# Patient Record
Sex: Female | Born: 1980 | Race: White | Hispanic: No | Marital: Single | Smoking: Never smoker
Health system: Southern US, Community
[De-identification: ages and names within clinical notes are randomized; demographics above are authoritative.]

## PROBLEM LIST (undated history)

## (undated) ENCOUNTER — Inpatient Hospital Stay (HOSPITAL_COMMUNITY): Payer: Self-pay

## (undated) DIAGNOSIS — B999 Unspecified infectious disease: Secondary | ICD-10-CM

## (undated) DIAGNOSIS — Z1589 Genetic susceptibility to other disease: Secondary | ICD-10-CM

## (undated) DIAGNOSIS — E7212 Methylenetetrahydrofolate reductase deficiency: Secondary | ICD-10-CM

## (undated) DIAGNOSIS — G971 Other reaction to spinal and lumbar puncture: Secondary | ICD-10-CM

## (undated) DIAGNOSIS — T7840XA Allergy, unspecified, initial encounter: Secondary | ICD-10-CM

## (undated) DIAGNOSIS — D759 Disease of blood and blood-forming organs, unspecified: Secondary | ICD-10-CM

## (undated) HISTORY — PX: TUBAL LIGATION: SHX77

## (undated) HISTORY — PX: TONSILLECTOMY AND ADENOIDECTOMY: SUR1326

## (undated) HISTORY — DX: Allergy, unspecified, initial encounter: T78.40XA

## (undated) HISTORY — PX: CHOLECYSTECTOMY: SHX55

## (undated) HISTORY — PX: WISDOM TOOTH EXTRACTION: SHX21

## (undated) HISTORY — DX: Other reaction to spinal and lumbar puncture: G97.1

---

## 2011-07-24 ENCOUNTER — Inpatient Hospital Stay (HOSPITAL_COMMUNITY)
Admission: EM | Admit: 2011-07-24 | Discharge: 2011-07-29 | DRG: 602 | Disposition: A | Discharge status: Home / Self Care | Payer: No Typology Code available for payment source | Attending: Internal Medicine | Admitting: Internal Medicine

## 2011-07-24 ENCOUNTER — Emergency Department (HOSPITAL_COMMUNITY): Payer: No Typology Code available for payment source

## 2011-07-24 ENCOUNTER — Encounter (HOSPITAL_COMMUNITY): Payer: Self-pay | Admitting: Radiology

## 2011-07-24 DIAGNOSIS — D72829 Elevated white blood cell count, unspecified: Secondary | ICD-10-CM | POA: Diagnosis present

## 2011-07-24 DIAGNOSIS — E7210 Disorders of sulfur-bearing amino-acid metabolism, unspecified: Secondary | ICD-10-CM | POA: Diagnosis present

## 2011-07-24 DIAGNOSIS — R000 Tachycardia, unspecified: Secondary | ICD-10-CM | POA: Diagnosis present

## 2011-07-24 DIAGNOSIS — I951 Orthostatic hypotension: Secondary | ICD-10-CM | POA: Diagnosis present

## 2011-07-24 DIAGNOSIS — R6510 Systemic inflammatory response syndrome (SIRS) of non-infectious origin without acute organ dysfunction: Secondary | ICD-10-CM | POA: Diagnosis present

## 2011-07-24 DIAGNOSIS — G43909 Migraine, unspecified, not intractable, without status migrainosus: Secondary | ICD-10-CM | POA: Diagnosis present

## 2011-07-24 DIAGNOSIS — L089 Local infection of the skin and subcutaneous tissue, unspecified: Secondary | ICD-10-CM | POA: Diagnosis present

## 2011-07-24 DIAGNOSIS — Z888 Allergy status to other drugs, medicaments and biological substances status: Secondary | ICD-10-CM

## 2011-07-24 DIAGNOSIS — Z882 Allergy status to sulfonamides status: Secondary | ICD-10-CM

## 2011-07-24 DIAGNOSIS — D649 Anemia, unspecified: Secondary | ICD-10-CM | POA: Diagnosis present

## 2011-07-24 DIAGNOSIS — L02619 Cutaneous abscess of unspecified foot: Principal | ICD-10-CM | POA: Diagnosis present

## 2011-07-24 DIAGNOSIS — A419 Sepsis, unspecified organism: Secondary | ICD-10-CM | POA: Diagnosis present

## 2011-07-24 DIAGNOSIS — E876 Hypokalemia: Secondary | ICD-10-CM | POA: Diagnosis not present

## 2011-07-24 DIAGNOSIS — D72825 Bandemia: Secondary | ICD-10-CM | POA: Diagnosis present

## 2011-07-24 DIAGNOSIS — N289 Disorder of kidney and ureter, unspecified: Secondary | ICD-10-CM | POA: Diagnosis present

## 2011-07-24 DIAGNOSIS — S90426A Blister (nonthermal), unspecified lesser toe(s), initial encounter: Secondary | ICD-10-CM | POA: Diagnosis present

## 2011-07-24 LAB — CBC
HCT: 38.1 % (ref 36.0–46.0)
Hemoglobin: 13.2 g/dL (ref 12.0–15.0)
MCH: 29 pg (ref 26.0–34.0)
MCHC: 34.6 g/dL (ref 30.0–36.0)
RDW: 13 % (ref 11.5–15.5)

## 2011-07-24 LAB — DIFFERENTIAL
Basophils Absolute: 0 10*3/uL (ref 0.0–0.1)
Lymphs Abs: 0.1 10*3/uL — ABNORMAL LOW (ref 0.7–4.0)
Monocytes Absolute: 0.4 10*3/uL (ref 0.1–1.0)
Monocytes Relative: 3 % (ref 3–12)
Neutro Abs: 14.1 10*3/uL — ABNORMAL HIGH (ref 1.7–7.7)

## 2011-07-24 LAB — URINALYSIS, ROUTINE W REFLEX MICROSCOPIC
Bilirubin Urine: NEGATIVE
Glucose, UA: NEGATIVE mg/dL
Ketones, ur: NEGATIVE mg/dL
pH: 5 (ref 5.0–8.0)

## 2011-07-24 LAB — POCT I-STAT, CHEM 8
Calcium, Ion: 1.24 mmol/L (ref 1.12–1.32)
Chloride: 107 mEq/L (ref 96–112)
Glucose, Bld: 115 mg/dL — ABNORMAL HIGH (ref 70–99)
HCT: 43 % (ref 36.0–46.0)
TCO2: 19 mmol/L (ref 0–100)

## 2011-07-24 LAB — URINE MICROSCOPIC-ADD ON

## 2011-07-24 LAB — LACTIC ACID, PLASMA: Lactic Acid, Venous: 1.4 mmol/L (ref 0.5–2.2)

## 2011-07-24 LAB — POCT PREGNANCY, URINE: Preg Test, Ur: NEGATIVE

## 2011-07-24 MED ORDER — IOHEXOL 300 MG/ML  SOLN
160.0000 mL | Freq: Once | INTRAMUSCULAR | Status: AC | PRN
  Administered 2011-07-24: 160 mL via INTRAVENOUS

## 2011-07-25 LAB — COMPREHENSIVE METABOLIC PANEL
ALT: 34 U/L (ref 0–35)
AST: 47 U/L — ABNORMAL HIGH (ref 0–37)
CO2: 20 mEq/L (ref 19–32)
Calcium: 7.6 mg/dL — ABNORMAL LOW (ref 8.4–10.5)
Chloride: 111 mEq/L (ref 96–112)
Creatinine, Ser: 0.89 mg/dL (ref 0.50–1.10)
GFR calc Af Amer: >60 mL/min (ref >60)
GFR calc non Af Amer: >60 mL/min (ref >60)
Glucose, Bld: 137 mg/dL — ABNORMAL HIGH (ref 70–99)
Total Bilirubin: 0.7 mg/dL (ref 0.3–1.2)

## 2011-07-25 LAB — DIFFERENTIAL
Basophils Absolute: 0 10*3/uL (ref 0.0–0.1)
Eosinophils Absolute: 0 10*3/uL (ref 0.0–0.7)
Lymphs Abs: 0.1 10*3/uL — ABNORMAL LOW (ref 0.7–4.0)
Monocytes Absolute: 0.4 10*3/uL (ref 0.1–1.0)

## 2011-07-25 LAB — MRSA PCR SCREENING: MRSA by PCR: NEGATIVE

## 2011-07-25 LAB — CBC
MCHC: 34.5 g/dL (ref 30.0–36.0)
RDW: 13.1 % (ref 11.5–15.5)

## 2011-07-26 LAB — CBC
HCT: 31.7 % — ABNORMAL LOW (ref 36.0–46.0)
Hemoglobin: 11.2 g/dL — ABNORMAL LOW (ref 12.0–15.0)
MCV: 83.9 fL (ref 78.0–100.0)
WBC: 8.7 10*3/uL (ref 4.0–10.5)

## 2011-07-26 LAB — DIFFERENTIAL
Basophils Relative: 0 % (ref 0–1)
Eosinophils Relative: 3 % (ref 0–5)
Lymphocytes Relative: 3 % — ABNORMAL LOW (ref 12–46)
Neutro Abs: 7.8 10*3/uL — ABNORMAL HIGH (ref 1.7–7.7)
Neutrophils Relative %: 90 % — ABNORMAL HIGH (ref 43–77)

## 2011-07-26 LAB — COMPREHENSIVE METABOLIC PANEL
Alkaline Phosphatase: 71 U/L (ref 39–117)
BUN: 8 mg/dL (ref 6–23)
CO2: 21 mEq/L (ref 19–32)
Chloride: 109 mEq/L (ref 96–112)
Creatinine, Ser: 1.12 mg/dL — ABNORMAL HIGH (ref 0.50–1.10)
GFR calc Af Amer: >60 mL/min (ref >60)
GFR calc non Af Amer: 58 mL/min — ABNORMAL LOW (ref >60)
Glucose, Bld: 116 mg/dL — ABNORMAL HIGH (ref 70–99)
Potassium: 3 mEq/L — ABNORMAL LOW (ref 3.5–5.1)
Total Bilirubin: 0.6 mg/dL (ref 0.3–1.2)

## 2011-07-27 LAB — CBC
MCH: 28.8 pg (ref 26.0–34.0)
Platelets: 154 10*3/uL (ref 150–400)
RBC: 3.68 MIL/uL — ABNORMAL LOW (ref 3.87–5.11)
WBC: 7 10*3/uL (ref 4.0–10.5)

## 2011-07-27 LAB — CULTURE, BLOOD (ROUTINE X 2)

## 2011-07-27 LAB — BASIC METABOLIC PANEL
CO2: 21 mEq/L (ref 19–32)
Calcium: 8.3 mg/dL — ABNORMAL LOW (ref 8.4–10.5)
Potassium: 3.7 mEq/L (ref 3.5–5.1)
Sodium: 137 mEq/L (ref 135–145)

## 2011-07-28 LAB — BASIC METABOLIC PANEL
BUN: 8 mg/dL (ref 6–23)
CO2: 19 mEq/L (ref 19–32)
Chloride: 113 mEq/L — ABNORMAL HIGH (ref 96–112)
GFR calc non Af Amer: >60 mL/min (ref >60)
Glucose, Bld: 94 mg/dL (ref 70–99)
Potassium: 3.2 mEq/L — ABNORMAL LOW (ref 3.5–5.1)
Sodium: 140 mEq/L (ref 135–145)

## 2011-07-28 LAB — CBC
HCT: 32.2 % — ABNORMAL LOW (ref 36.0–46.0)
Hemoglobin: 11 g/dL — ABNORMAL LOW (ref 12.0–15.0)
MCHC: 34.2 g/dL (ref 30.0–36.0)
RBC: 3.86 MIL/uL — ABNORMAL LOW (ref 3.87–5.11)

## 2011-07-29 LAB — BASIC METABOLIC PANEL
BUN: 9 mg/dL (ref 6–23)
CO2: 22 mEq/L (ref 19–32)
Calcium: 9.2 mg/dL (ref 8.4–10.5)
Chloride: 110 mEq/L (ref 96–112)
Creatinine, Ser: 1.03 mg/dL (ref 0.50–1.10)
GFR calc Af Amer: >60 mL/min (ref >60)
GFR calc non Af Amer: >60 mL/min (ref >60)
Glucose, Bld: 101 mg/dL — ABNORMAL HIGH (ref 70–99)
Potassium: 3.3 mEq/L — ABNORMAL LOW (ref 3.5–5.1)
Sodium: 142 mEq/L (ref 135–145)

## 2011-07-29 LAB — VANCOMYCIN, TROUGH: Vancomycin Tr: 28.6 ug/mL (ref 10.0–20.0)

## 2011-07-30 NOTE — Consult Note (Signed)
NAMEIMMACULATE, CRUTCHER NO.:  [id number]  MEDICAL RECORD NO.:  [id number]  LOCATION:  5015                         FACILITY:  [facility]  PHYSICIAN:  Toni Arthurs, MD        DATE OF BIRTH:  21-Aug-1981  DATE OF CONSULTATION: DATE OF DISCHARGE:                                CONSULTATION   REASON FOR CONSULTATION:  Right foot cellulitis and ulcer formation.  HISTORY OF PRESENT ILLNESS:  The patient is a 30 year old woman without significant past medical history who was admitted last week for cellulitis of her right foot as well as a syncopal episode at work.  She says the blister started on Wednesday of last week when she was walking a lot at her new job and heels.  She says that by Friday the blister had popped and she put a Band-Aid on it.  Later that day at work she developed lightheadedness and dizziness.  She came to the emergency department and was found to have an elevated white blood cell count. She was admitted to the step-down unit and started on vancomycin and Zosyn.  She has had clinical resolution of her cellulitis but persist with having a large fluctuant mass filled with purulent material.  I am consulted for further evaluation and treatment of this condition.  The patient denies any history of diabetes or thyroid trouble.  She is not a smoker.  PAST MEDICAL HISTORY:  Migraines.  PAST SURGICAL HISTORY:  None.  SOCIAL HISTORY:  The patient does not use any tobacco products.  She works in Presenter, broadcasting at [employer].  FAMILY HISTORY:  Negative for any diabetes.  PHYSICAL EXAMINATION:  The patient is a well nourished, well developed young woman in no apparent stress.  She is alert and oriented x4.  Mood and affect is normal.  Extraocular motions are intact.  Respirations are unlabored.  Her right foot has a blister which is approximately 3 cm x 2 cm and it is quite raised and has at least a centimeter high.  There is a scant amount of  erythema around the periphery of it.  The blisters itself seems to be filled with either purulent material or necrotic material.  It is fluctuant.  It is quite tender to palpation.  The dorsum of the foot is somewhat tender but her extrinsic tendon is 5/5 strength.  She has 2+ dorsalis pedis and posterior tibial pulses.  She feels light touch normally throughout her foot.  There is no lymphadenopathy noted.  X-rays, plain films of her foot are reviewed and these are normal with no evidence of erosion into the cortical bone at the fifth toe.  Labs, her white blood cell count is normal.  Her vital signs have all been stable and she has been afebrile as well.  ASSESSMENT:  Resolving right foot cellulitis with purulent or necrotic material in this painful blister.  PLAN:  I explained the nature of this situation to the patient and her father in detail.  I believe that this would at least be much more comfortable with the blister removed.  I think this will also allow [place] to inspect the  underlying tissues to see if these are at any risk for further deterioration.  She will lose the protective covering of the skin over this area, but we will also lose the necrotic material which can serve as a nidus for further infection.  She understands the risks and benefits of this debridement including risks of bleeding, infection, nerve damage, blood clots, need for surgery, amputation and even death.  She would like to proceed.  PROCEDURE IN DETAIL:  After informed consent, I debrided the blister of its covering in all of the necrotic material.  The underlying subcutaneous tissue was intact and healthy appearing with no necrotic areas.  There was no evidence of any exposed tendon joint or bone.  I irrigated this wound copiously with sterile saline.  We applied a nonadherent dressing and a compression wrap  We will let her bear weight as tolerated on the right foot in a hard sole shoe.  She is  going to shower and clean it off daily and apply dry dressing.  I will follow her while she is in the hospital and then she will follow up with me a week or so after her discharge from the hospital.  The patient tolerated the procedure well.  There were no evident of complications.     Toni Arthurs, MD     JH/MEDQ  D:  07/28/2011  T:  07/29/2011  Job:  [id number]  Electronically Signed by Toni Arthurs  on 07/30/2011 12:12:18 PM

## 2011-07-31 LAB — CULTURE, BLOOD (SINGLE)
Culture  Setup Time: 201209221102
Culture: NO GROWTH

## 2011-07-31 LAB — CULTURE, BLOOD (ROUTINE X 2)

## 2011-08-05 NOTE — H&P (Signed)
Lisa Weaver, TEXIDOR NO.:  [id number]  MEDICAL RECORD NO.:  [id number]  LOCATION:  [facility]                         FACILITY:  [facility]  PHYSICIAN:  Carlota Raspberry, MD         DATE OF BIRTH:  10-16-1981  DATE OF ADMISSION:  07/24/2011 DATE OF DISCHARGE:                             HISTORY & PHYSICAL   PRIMARY CARE PHYSICIAN:  Does not have one.  CHIEF COMPLAINTS:  Fevers, tachycardia, right foot erythema.  HISTORY OF PRESENT ILLNESS:  This is a 30 year old female overall healthy but has a history of MTHFR gene mutation who presents with an apparent right foot infection, hypertension, and tachycardia.  The patient was in her usual state of health until a couple of days ago when she bought a new pair of heels and was wearing them yesterday morning.  She noticed that there was a blister on the side of her right pinky toe that she put some antibiotic ointment on and went about her daily business and then this morning she noticed that there was some right streakiness on her foot and at work she had a presyncopal event where she felt nauseated, dizziness, and almost passed out but she did not have any loss of consciousness.  She came to the emergency room where initial vital signs were 98.9, blood pressure 124/82, and a pulse of 127 with respirations 19 and 96% on room air.  She proceeded to spike a fever up to 101.6 in the emergency room and they also noticed that the dorsal aspect of her right foot had an ascending line of redness.  She was given a total of 3 liters normal saline, 1 gram of vancomycin, 3.375 of Zosyn, and a gram of Tylenol.  She stated that she was having a little bit of mild abdominal pain and in consultation with Critical Care Medicine.  They then proceeded to scan her chest and abdomen and pelvis with contrast which basically showed nothing  She has been admitted to [facility] for further management of what appears to be an ascending  lymphangitis  In discussion with the patient, she relates the above history and states that otherwise she has been in her usual state of health.  She has no cardiopulmonary symptoms.  The belly pain she has never had before either and it is located in her lower quadrants but it appears to be pretty mild and not her prominent complaint.  She is otherwise without complaint at present.  PAST MEDICAL HISTORY:  Migraines for which she takes topiramate.  MTHFR gene mutation, this was diagnosed by an OB/GYN after she had a stillbirth at 8 months and a high risk OB/GYN doctor ran this test and found that she was positive.  It apparently is associated with hypercoagulability.  Otherwise she is healthy.  HOME MEDICATIONS:  Topamax 100 mg twice daily and an albuterol inhaler as needed.  ALLERGIES:  She has an allergy to SULFA, ALMOTRIPTAN, and CEFZIL.  SOCIAL HISTORY:  She just moved here not long ago and ambulatory working young lady.  She is a nonsmoker, nondrinker, does not do any drugs.  PHYSICAL EXAMINATION:  VITAL SIGNS:  Most  recent vital signs 93/51, ranging 93-100 on the monitor, pulse 133, 100% on room air and 25. GENERAL:  She is larger girl but not obese.  She is very diffusely red and warm and flushed but she is alert, conversant in no distress but does appear ill. HEENT:  Her pupils are equal, round, and reactive to light.  Her extraocular muscles are intact.  Her sclerae are clear.  Her mouth is a bit dry appearing. LUNGS:  Clear to auscultation bilaterally.  No wheezes, crackles, rales, or rhonchi. HEART:  Regular but tachycardic with no murmurs or gallops appreciated. ABDOMEN:  Soft, nontender, nondistended and frankly not that impressive in her bilateral lower quadrants. EXTREMITIES:  Warm, very flushed, well perfused.  Her bilateral lower extremities have no pitting edema in a classic sense but they do.  She does have right dorsal foot swelling. Her right fifth toe on  the lateral aspect has approximately 2 x 1 cm bullous blister surrounded by a ring of purple on the skin.  The blister has not broken and it is not purulent.  It is filled with clear fluid.  Coming from the blister and streaking up towards her dorsal foot is a linear but poorly defined erythematous streak.  It is tender to palpation and there is surrounding swelling that is mild.  The left foot appears normal but going up towards the knees there is some diffuse erythema.  LABORATORY WORK:  White blood cell count is 14.6 with 96% neutrophils and greater than 20% bands, hematocrit 38.1, platelets 213,000. Chemistry is completely normal.  Her urine pregnancy is negative.  Her UA is completely negative.  Her lactate is 1.4.  She has 2 blood cultures pending.  Radiography showed a foot x-ray which was negative and a CT angio of the chest and CT abdomen, pelvis with contrast which were unimpressive and did not explain her clinical presentation.  EKG is normal sinus rhythm with tachycardia to 130.  She has a normal axis.  She has some diffuse T-wave flattening that is nonspecific.  IMPRESSION:  This is a healthy 30 year old female with migraines and a history of methylene tetrahydrofolate reductase gene mutation who presents with fevers, tachycardia, leukocytosis, and bandemia and what appears to be a right foot ascending lymphangitis 1. Ascending lymphangitis.  We will continue on vancomycin and Zosyn     empirically and give her a gram of Tylenol scheduled.  We will get     LFTs x1.  We will blood culture her when she spikes.  I would also     mark the borders now and if it is rapidly worsening, then we would     need to get a CT to rule out necrotizing fasciitis. 2. Hypotension.  Her baseline blood pressures run 110 and she is just     below her baseline in the 90s-100s here.  She is making urine     output and her lactate is negative.  She is status post 3 liters.     We will bolus her  fourth and then give her continuous fluids and     monitor her closely in the Step-Down Unit. 3. Migraines.  Continue home Topamax. 4. __________ as above.  She can get a regular diet. 5. Prophylaxis.  Subcutaneous heparin, Colace, senna, Zofran,     scheduled Tylenol and morphine for breakthrough.  IV ACCESS:  She has a peripheral gauge 18 in her left.  CODE STATUS:  She is full.  I discussed this  with her.  She will be admitted to [facility] Team 7 in the Step-Down Unit.          ______________________________ Carlota Raspberry, MD     EB/MEDQ  D:  07/24/2011  T:  07/25/2011  Job:  [id number]  Electronically Signed by Carlota Raspberry MD on 08/05/2011 12:22:12 PM

## 2011-08-14 NOTE — Discharge Summary (Signed)
Lisa Weaver, Lisa Weaver                 ACCOUNT NO.:  [id number]  MEDICAL RECORD NO.:  [id number]  LOCATION:  5015                         FACILITY:  [facility]  PHYSICIAN:  Kela Millin, M.D.DATE OF BIRTH:  11-13-1980  DATE OF ADMISSION:  07/24/2011 DATE OF DISCHARGE:  07/29/2011                        DISCHARGE SUMMARY - REFERRING   DISCHARGE DIAGNOSES: 1. Right foot cellulitis with infected fifth toe blister. 2. Sepsis syndrome - secondary to right foot cellulitis with infected     fifth toe blister, resolved. 3. Hypokalemia - potassium replaced. 4. History of migraine headaches. 5. History of MTHFR gene mutation.  PROCEDURES AND STUDIES: 1. CT scan of the abdomen and pelvis on September 21 - there are no     acute findings identified within the abdomen and pelvis.  No     findings to explain fever and elevated white count. 2. X-ray of the right foot - normal exam. 3. CT angiogram of the chest - negative for acute pulmonary embolus. 4. Incision and drainage of the right fifth toe blister per Dr. Victorino Dike     on July 28, 2011.  CONSULTATIONS: 1. Orthopedics, Dr. Victorino Dike.  HISTORY:  Please see the history and physical dictated on admission for the full details of the history and laboratory data on admission.  HOSPITAL COURSE: 1. Right foot cellulitis with infected fifth toe blister - upon     admission, the patient had blood cultures done and was started on     empiric antibiotics with Levaquin and vancomycin.  The cellulitis     improved on the antibiotics, the blister was persisting, and wound     care was consulted to see the patient, and they did first stated     that topical care would not be effective, and recommended     Orthopedic consultation.  This was done and Dr. Victorino Dike saw the     patient and his impression was that her cellulitis was resolving,     but that blister had purulent or necrotic material in it and it was     painful.  He debrided the blister and  recommended to continue the     antibiotics.  He has recommended that she follows up with him     outpatient in a week.  Her leukocytosis resolved as well as     hypotension.  She had three cultures drawn while in the hospital     and one of those grew staph species and their impression was that     this was a contaminant.  She is clinically improved at this time     and a hard sole shoe for comfort was prescribed per Dr. Victorino Dike and     again she is to follow up outpatient. 2. Sepsis syndrome - the patient was noted to be hypotensive with     leukocytosis and was also tachycardic on admission.  She did have a     CT angiogram of the chest and a PE was ruled out.  CT scan of her     abdomen and pelvis was also done and no acute findings were noted.     The impression was that the  sepsis syndrome was secondary to Right     foot cellulitis with infected fifth toe blister, and resolved on     antibiotics as discussed above. 3. Hypokalemia - her potassium was replaced in the hospital. 4. History of migraine headaches - she was maintained on Topamax     during this hospital stay.  DISCHARGE MEDICATIONS: 1. Levaquin 500 mg p.o. daily for one more week. 2. Oxycodone 5 mg one to two tablets every 4 hours p.r.n. 3. Albuterol MDI q.4 h. p.r.n. 4. Topamax 100 mg p.o. b.i.d.  FOLLOWUP CARE: 1. Dr. Victorino Dike in 1 week, call for appointment. 2. Primary care physician in 1-2 weeks.  DISCHARGE CONDITION:  Improved/stable.     Kela Millin, M.D.     ACV/MEDQ  D:  07/29/2011  T:  07/29/2011  Job:  [id number]  cc:   Toni Arthurs, MD  Electronically Signed by Donnalee Curry M.D. on 08/14/2011 07:27:48 PM

## 2011-12-10 ENCOUNTER — Ambulatory Visit (INDEPENDENT_AMBULATORY_CARE_PROVIDER_SITE_OTHER): Payer: No Typology Code available for payment source | Admitting: Advanced Practice Midwife

## 2011-12-10 ENCOUNTER — Encounter: Payer: Self-pay | Admitting: Advanced Practice Midwife

## 2011-12-10 ENCOUNTER — Ambulatory Visit (HOSPITAL_COMMUNITY)
Admission: RE | Admit: 2011-12-10 | Discharge: 2011-12-10 | Disposition: A | Discharge status: Home / Self Care | Payer: No Typology Code available for payment source | Source: Ambulatory Visit | Attending: Advanced Practice Midwife | Admitting: Advanced Practice Midwife

## 2011-12-10 DIAGNOSIS — Z1589 Genetic susceptibility to other disease: Secondary | ICD-10-CM

## 2011-12-10 DIAGNOSIS — E7212 Methylenetetrahydrofolate reductase deficiency: Secondary | ICD-10-CM

## 2011-12-10 DIAGNOSIS — E7210 Disorders of sulfur-bearing amino-acid metabolism, unspecified: Secondary | ICD-10-CM

## 2011-12-10 DIAGNOSIS — Z30432 Encounter for removal of intrauterine contraceptive device: Secondary | ICD-10-CM

## 2011-12-10 DIAGNOSIS — IMO0001 Reserved for inherently not codable concepts without codable children: Secondary | ICD-10-CM

## 2011-12-10 DIAGNOSIS — Z975 Presence of (intrauterine) contraceptive device: Secondary | ICD-10-CM

## 2011-12-10 DIAGNOSIS — G43909 Migraine, unspecified, not intractable, without status migrainosus: Secondary | ICD-10-CM | POA: Insufficient documentation

## 2011-12-10 DIAGNOSIS — Z30431 Encounter for routine checking of intrauterine contraceptive device: Secondary | ICD-10-CM | POA: Insufficient documentation

## 2011-12-10 NOTE — Patient Instructions (Signed)

## 2011-12-10 NOTE — Progress Notes (Signed)
  Subjective:    Patient ID: Lisa Weaver, female    DOB: 1981-06-26, 31 y.o.   MRN: [id number]  HPI This is a 31 y.o. female with a Mirena IUD who presents for removal of it. States she went to [facility] and they were unable to find the strings. They tried removal with a cytobrush, without success.  She is near the end of the term of it (5 years in November) but wants it removed "because of the lawyer commercials which say it might grow into my uterus".  Has had no problems with IUD.  Does not want any other type of contraceptive after removal.  Review of Systems Negative    Objective:   Physical Exam Deferred per patient. States she was told she would have an [place] today. Does not want to do an exam.      Assessment & Plan:  A:  IUD, Mirena, desires removal      Strings not visible P:  Will have US done today      Will bring back next week for removal

## 2011-12-24 ENCOUNTER — Ambulatory Visit: Payer: No Typology Code available for payment source | Admitting: Physician Assistant

## 2011-12-25 ENCOUNTER — Ambulatory Visit (INDEPENDENT_AMBULATORY_CARE_PROVIDER_SITE_OTHER): Payer: Self-pay | Admitting: Obstetrics and Gynecology

## 2011-12-25 ENCOUNTER — Encounter: Payer: Self-pay | Admitting: Obstetrics and Gynecology

## 2011-12-25 VITALS — BP 116/82 | HR 87 | Temp 98.2°F | Ht 66.0 in | Wt 213.4 lb

## 2011-12-25 DIAGNOSIS — Z30432 Encounter for removal of intrauterine contraceptive device: Secondary | ICD-10-CM

## 2011-12-25 DIAGNOSIS — Z30431 Encounter for routine checking of intrauterine contraceptive device: Secondary | ICD-10-CM

## 2011-12-25 MED ORDER — TH PRENATAL VITAMINS 28-0.8 MG PO TABS: 1.0000 | ORAL_TABLET | Freq: Every day | ORAL | Status: DC

## 2011-12-25 NOTE — Progress Notes (Signed)
Addended by: Danae Orleans on: 12/25/2011 09:39 AM   Modules accepted: Level of Service

## 2011-12-25 NOTE — Progress Notes (Addendum)
  Subjective:    Patient ID: Lisa Weaver, female    DOB: 11-20-1980, 31 y.o.   MRN: [id number]  HPI  1. Desires IUD removal because she is nearing end of 4 years and is nervous regarding seeing TV commercials about possible migration. [facility]ound showed IUD in appropriate position. Denies any abdominal pain, abnormal bleeding. Does not desire to start alternative contraception today.   Review of Systems See HPI otherwise negative.    Objective:   Physical Exam  Constitutional: She appears well-developed and well-nourished. No distress.  Genitourinary: Vagina normal and uterus normal. No vaginal discharge found.       Assessment & Plan:    IUD Removal  Patient was in the dorsal lithotomy position, normal external genitalia was noted.  A speculum was placed in the patient's vagina, normal discharge was noted, no lesions. The multiparous cervix was visualized, no lesions, no abnormal discharge,  and was swabbed with Betadine using scopettes. Strings were not visible immediately. Curved kelley and hook were used to bring strings through the cervical os. The strings of the IUD was grasped and pulled using ring forceps.  The IUD was successfully removed in its entirety.  Patient tolerated the procedure well.    Saw pt and agree with above.

## 2011-12-25 NOTE — Patient Instructions (Signed)
Start taking prenatal vitamins. You can get pregnant, so use condoms or return for birth control if desired. Be sure to have yearly physical exams.   Health Maintenance, Females A healthy lifestyle and preventative care can promote health and wellness.  Maintain regular health, dental, and eye exams.   Eat a healthy diet. Foods like vegetables, fruits, whole grains, low-fat dairy products, and lean protein foods contain the nutrients you need without too many calories. Decrease your intake of foods high in solid fats, added sugars, and salt. Get information about a proper diet from your caregiver, if necessary.   Regular physical exercise is one of the most important things you can do for your health. Most adults should get at least 150 minutes of moderate-intensity exercise (any activity that increases your heart rate and causes you to sweat) each week. In addition, most adults need muscle-strengthening exercises on 2 or more days a week.    Maintain a healthy weight. The body mass index (BMI) is a screening tool to identify possible weight problems. It provides an estimate of body fat based on height and weight. Your caregiver can help determine your BMI, and can help you achieve or maintain a healthy weight. For adults 20 years and older:   A BMI below 18.5 is considered underweight.   A BMI of 18.5 to 24.9 is normal.   A BMI of 25 to 29.9 is considered overweight.   A BMI of 30 and above is considered obese.   Maintain normal blood lipids and cholesterol by exercising and minimizing your intake of saturated fat. Eat a balanced diet with plenty of fruits and vegetables. Blood tests for lipids and cholesterol should begin at age 25 and be repeated every 5 years. If your lipid or cholesterol levels are high, you are over 50, or you are a high risk for heart disease, you may need your cholesterol levels checked more frequently.Ongoing high lipid and cholesterol levels should be treated with  medicines if diet and exercise are not effective.   If you smoke, find out from your caregiver how to quit. If you do not use tobacco, do not start.   If you are pregnant, do not drink alcohol. If you are breastfeeding, be very cautious about drinking alcohol. If you are not pregnant and choose to drink alcohol, do not exceed 1 drink per day. One drink is considered to be 12 ounces (355 mL) of beer, 5 ounces (148 mL) of wine, or 1.5 ounces (44 mL) of liquor.   Avoid use of street drugs. Do not share needles with anyone. Ask for help if you need support or instructions about stopping the use of drugs.   High blood pressure causes heart disease and increases the risk of stroke. Blood pressure should be checked at least every 1 to 2 years. Ongoing high blood pressure should be treated with medicines, if weight loss and exercise are not effective.   If you are 18 to 30 years old, ask your caregiver if you should take aspirin to prevent strokes.   Diabetes screening involves taking a blood sample to check your fasting blood sugar level. This should be done once every 3 years, after age 32, if you are within normal weight and without risk factors for diabetes. Testing should be considered at a younger age or be carried out more frequently if you are overweight and have at least 1 risk factor for diabetes.   Breast cancer screening is essential preventative care for women.  You should practice "breast self-awareness." This means understanding the normal appearance and feel of your breasts and may include breast self-examination. Any changes detected, no matter how small, should be reported to a caregiver. Women in their 49s and 30s should have a clinical breast exam (CBE) by a caregiver as part of a regular health exam every 1 to 3 years. After age 73, women should have a CBE every year. Starting at age 50, women should consider having a mammogram (breast X-ray) every year. Women who have a family history of  breast cancer should talk to their caregiver about genetic screening. Women at a high risk of breast cancer should talk to their caregiver about having an MRI and a mammogram every year.   The Pap test is a screening test for cervical cancer. Women should have a Pap test starting at age 14. Between ages 17 and 52, Pap tests should be repeated every 2 years. Beginning at age 65, you should have a Pap test every 3 years as long as the past 3 Pap tests have been normal. If you had a hysterectomy for a problem that was not cancer or a condition that could lead to cancer, then you no longer need Pap tests. If you are between ages 59 and 60, and you have had normal Pap tests going back 10 years, you no longer need Pap tests. If you have had past treatment for cervical cancer or a condition that could lead to cancer, you need Pap tests and screening for cancer for at least 20 years after your treatment. If Pap tests have been discontinued, risk factors (such as a new sexual partner) need to be reassessed to determine if screening should be resumed. Some women have medical problems that increase the chance of getting cervical cancer. In these cases, your caregiver may recommend more frequent screening and Pap tests.   The human papillomavirus (HPV) test is an additional test that may be used for cervical cancer screening. The HPV test looks for the virus that can cause the cell changes on the cervix. The cells collected during the Pap test can be tested for HPV. The HPV test could be used to screen women aged 30 years and older, and should be used in women of any age who have unclear Pap test results. After the age of 55, women should have HPV testing at the same frequency as a Pap test.   Colorectal cancer can be detected and often prevented. Most routine colorectal cancer screening begins at the age of 38 and continues through age 35. However, your caregiver may recommend screening at an earlier age if you have risk  factors for colon cancer. On a yearly basis, your caregiver may provide home test kits to check for hidden blood in the stool. Use of a small camera at the end of a tube, to directly examine the colon (sigmoidoscopy or colonoscopy), can detect the earliest forms of colorectal cancer. Talk to your caregiver about this at age 76, when routine screening begins. Direct examination of the colon should be repeated every 5 to 10 years through age 55, unless early forms of pre-cancerous polyps or small growths are found.   Practice safe sex. Use condoms and avoid high-risk sexual practices to reduce the spread of sexually transmitted infections (STIs). Sexually active women aged 58 and younger should be checked for Chlamydia, which is a common sexually transmitted infection. Older women with new or multiple partners should also be tested for Chlamydia. Testing  for other STIs is recommended if you are sexually active and at increased risk.   Osteoporosis is a disease in which the bones lose minerals and strength with aging. This can result in serious bone fractures. The risk of osteoporosis can be identified using a bone density scan. Women ages 11 and over and women at risk for fractures or osteoporosis should discuss screening with their caregivers. Ask your caregiver whether you should be taking a calcium supplement or vitamin D to reduce the rate of osteoporosis.   Menopause can be associated with physical symptoms and risks. Hormone replacement therapy is available to decrease symptoms and risks. You should talk to your caregiver about whether hormone replacement therapy is right for you.   Use sunscreen with a sun protection factor (SPF) of 30 or greater. Apply sunscreen liberally and repeatedly throughout the day. You should seek shade when your shadow is shorter than you. Protect yourself by wearing long sleeves, pants, a wide-brimmed hat, and sunglasses year round, whenever you are outdoors.   Notify your  caregiver of new moles or changes in moles, especially if there is a change in shape or color. Also notify your caregiver if a mole is larger than the size of a pencil eraser.   Stay current with your immunizations.  Document Released: 05/04/2011 Document Revised: 07/01/2011 Document Reviewed: 05/04/2011 [facility] Patient Information 2012 [place], [place].

## 2012-07-14 ENCOUNTER — Encounter: Payer: Self-pay | Admitting: *Deleted

## 2012-07-14 ENCOUNTER — Encounter: Payer: Self-pay | Admitting: Obstetrics and Gynecology

## 2012-07-14 ENCOUNTER — Other Ambulatory Visit: Payer: Self-pay

## 2012-07-14 LAB — POCT PREGNANCY, URINE: Preg Test, Ur: POSITIVE — AB

## 2012-07-14 NOTE — Progress Notes (Unsigned)
Pt came today for UPT and pregnancy verification letter so that she can apply for medicaid.  UPT= Positive.

## 2012-07-27 ENCOUNTER — Ambulatory Visit (INDEPENDENT_AMBULATORY_CARE_PROVIDER_SITE_OTHER): Payer: Medicaid Other | Admitting: Advanced Practice Midwife

## 2012-07-27 ENCOUNTER — Encounter: Payer: Self-pay | Admitting: Advanced Practice Midwife

## 2012-07-27 VITALS — BP 112/79 | Temp 97.6°F | Wt 230.8 lb

## 2012-07-27 DIAGNOSIS — O09299 Supervision of pregnancy with other poor reproductive or obstetric history, unspecified trimester: Secondary | ICD-10-CM

## 2012-07-27 DIAGNOSIS — O09219 Supervision of pregnancy with history of pre-term labor, unspecified trimester: Secondary | ICD-10-CM

## 2012-07-27 DIAGNOSIS — Z1589 Genetic susceptibility to other disease: Secondary | ICD-10-CM

## 2012-07-27 DIAGNOSIS — Z9889 Other specified postprocedural states: Secondary | ICD-10-CM

## 2012-07-27 DIAGNOSIS — Z8759 Personal history of other complications of pregnancy, childbirth and the puerperium: Secondary | ICD-10-CM | POA: Insufficient documentation

## 2012-07-27 DIAGNOSIS — E7210 Disorders of sulfur-bearing amino-acid metabolism, unspecified: Secondary | ICD-10-CM

## 2012-07-27 DIAGNOSIS — Z23 Encounter for immunization: Secondary | ICD-10-CM

## 2012-07-27 DIAGNOSIS — E7212 Methylenetetrahydrofolate reductase deficiency: Secondary | ICD-10-CM

## 2012-07-27 DIAGNOSIS — O09899 Supervision of other high risk pregnancies, unspecified trimester: Secondary | ICD-10-CM

## 2012-07-27 DIAGNOSIS — Z98891 History of uterine scar from previous surgery: Secondary | ICD-10-CM | POA: Insufficient documentation

## 2012-07-27 LAB — POCT URINALYSIS DIP (DEVICE)
Leukocytes, UA: NEGATIVE
Protein, ur: NEGATIVE mg/dL
Urobilinogen, UA: 0.2 mg/dL (ref 0.0–1.0)

## 2012-07-27 LAB — OB RESULTS CONSOLE GC/CHLAMYDIA: Gonorrhea: NEGATIVE

## 2012-07-27 MED ORDER — INFLUENZA VIRUS VACC SPLIT PF IM SUSP
0.5000 mL | Freq: Once | INTRAMUSCULAR | Status: AC
  Administered 2012-07-27: 0.5 mL via INTRAMUSCULAR

## 2012-07-27 NOTE — Progress Notes (Signed)
P-90 

## 2012-07-27 NOTE — Progress Notes (Addendum)
  Subjective:    Jimmy Dunnington is being seen today for her first obstetrical visit. She is at [redacted]w[redacted]d gestation by certain, irregular LMP (28 day cycles until May, then July cycle ~45 days later. Her obstetrical history is significant for:  1. MTHFR gene mutation (Dx after still birth G1, on Lovenox w/ G2) 2. Hx of Stillbirth 3. Previous C/S x 1 at 36 weeks due to going in to labor after Amniocentesis for lung maturity performed for planned early delivery due to previous IUFD. 4. Hx PTD (see above)  Relationship with FOB: spouse, living together. Patient is unsure if she will breast feed due to difficulties with previous child. Pregnancy history fully reviewed.  Patient reports no complaints.  Review of Systems:   Review of Systems: Denies fever, chills, VB, abd pain, N/V or dental pain.   Objective:     BP 112/79  Temp 97.6 F (36.4 C)  Wt 230 lb 12.8 oz (104.69 kg)  LMP 05/29/2012 Physical Exam  Exam Physical Examination: General appearance - alert, well appearing, and in no distress, oriented to person, place, and time and overweight Neck - supple, no significant adenopathy, thyroid exam: thyroid is normal in size without nodules or tenderness Chest - clear to auscultation, no wheezes, rales or rhonchi, symmetric air entry Heart - normal rate, regular rhythm, normal S1, S2, no murmurs, rubs, clicks or gallops Abdomen - soft, nontender, nondistended, no masses or organomegaly Well-healed low transverse C/S scar. Unable to palpate fundus or doppler FHR. Pelvic - normal external genitalia, vulva, vagina, cervix, uterus and adnexa, exam limited by body habitus and abd scar. Back exam - No CVAT Extremities - no edema, redness or tenderness in the calves or thighs  Assessment:    Pregnancy: [id number] Patient Active Problem List  Diagnosis  . MTHFR mutation  . Migraine  . Supervision of other high-risk pregnancy    Plan:   1. Supervision of other high-risk pregnancy  Prenatal (OB  Panel), HIV Antibody ( Reflex), Cytology - PAP, Culture, OB Urine, Glucose Tolerance, 1 HR (50g), US OB Comp Less 14 Wks  2. MTHFR mutation    3. Need for prophylactic vaccination and inoculation against influenza  influenza  inactive virus vaccine (FLUZONE/FLUARIX) injection 0.5 mL  4. History of stillbirth    5. History of preterm delivery, currently pregnant    6. History of cesarean delivery     Initial labs drawn. Prenatal vitamins. Problem list reviewed and updated. AFP3 discussed: will decide after dating [place].Marland Kitchen Role of ultrasound in pregnancy discussed; fetal survey: planned. Amniocentesis discussed: not indicated. Follow up in 1 week in [facility] w/ MD to discuss anticoagulation therapy.  75% of 45 min visit spent on counseling and coordination of care.  Records requested from prior pregnancies. Do not plan 17-P due to amnio likely precipitating PTL.  Dorathy Kinsman 07/27/2012

## 2012-07-28 LAB — OBSTETRIC PANEL
Basophils Absolute: 0 10*3/uL (ref 0.0–0.1)
Basophils Relative: 0 % (ref 0–1)
Eosinophils Relative: 2 % (ref 0–5)
Lymphocytes Relative: 20 % (ref 12–46)
MCV: 80.6 fL (ref 78.0–100.0)
Neutro Abs: 4.8 10*3/uL (ref 1.7–7.7)
Platelets: 384 10*3/uL (ref 150–400)
RDW: 13.5 % (ref 11.5–15.5)
Rubella: 31.6 IU/mL — ABNORMAL HIGH
WBC: 6.8 10*3/uL (ref 4.0–10.5)

## 2012-07-28 LAB — HIV ANTIBODY (ROUTINE TESTING W REFLEX): HIV: NONREACTIVE

## 2012-07-28 LAB — GLUCOSE TOLERANCE, 1 HOUR (50G) W/O FASTING: Glucose, 1 Hour GTT: 118 mg/dL (ref 70–140)

## 2012-07-29 LAB — CULTURE, OB URINE: Organism ID, Bacteria: NO GROWTH

## 2012-08-02 ENCOUNTER — Encounter: Payer: Self-pay | Admitting: Advanced Practice Midwife

## 2012-08-03 ENCOUNTER — Other Ambulatory Visit: Payer: Self-pay | Admitting: Advanced Practice Midwife

## 2012-08-03 ENCOUNTER — Ambulatory Visit (HOSPITAL_COMMUNITY)
Admission: RE | Admit: 2012-08-03 | Discharge: 2012-08-03 | Disposition: A | Discharge status: Home / Self Care | Payer: Medicaid Other | Source: Ambulatory Visit | Attending: Advanced Practice Midwife | Admitting: Advanced Practice Midwife

## 2012-08-03 DIAGNOSIS — O09899 Supervision of other high risk pregnancies, unspecified trimester: Secondary | ICD-10-CM

## 2012-08-03 DIAGNOSIS — O09299 Supervision of pregnancy with other poor reproductive or obstetric history, unspecified trimester: Secondary | ICD-10-CM | POA: Insufficient documentation

## 2012-08-03 DIAGNOSIS — Z8751 Personal history of pre-term labor: Secondary | ICD-10-CM | POA: Insufficient documentation

## 2012-08-04 ENCOUNTER — Encounter: Payer: Self-pay | Admitting: Family Medicine

## 2012-08-04 ENCOUNTER — Ambulatory Visit (INDEPENDENT_AMBULATORY_CARE_PROVIDER_SITE_OTHER): Payer: Medicaid Other | Admitting: Family Medicine

## 2012-08-04 VITALS — BP 110/78 | Temp 97.5°F | Wt 229.8 lb

## 2012-08-04 DIAGNOSIS — O09299 Supervision of pregnancy with other poor reproductive or obstetric history, unspecified trimester: Secondary | ICD-10-CM

## 2012-08-04 DIAGNOSIS — O09219 Supervision of pregnancy with history of pre-term labor, unspecified trimester: Secondary | ICD-10-CM

## 2012-08-04 DIAGNOSIS — Z8759 Personal history of other complications of pregnancy, childbirth and the puerperium: Secondary | ICD-10-CM

## 2012-08-04 LAB — POCT URINALYSIS DIP (DEVICE)
Bilirubin Urine: NEGATIVE
Glucose, UA: NEGATIVE mg/dL
Ketones, ur: NEGATIVE mg/dL
Leukocytes, UA: NEGATIVE
Nitrite: NEGATIVE
Protein, ur: NEGATIVE mg/dL
Specific Gravity, Urine: 1.025 (ref 1.005–1.030)
Urobilinogen, UA: 0.2 mg/dL (ref 0.0–1.0)
pH: 6 (ref 5.0–8.0)

## 2012-08-04 NOTE — Progress Notes (Signed)
P=93 

## 2012-08-04 NOTE — Patient Instructions (Signed)
Pregnancy - First Trimester During sexual intercourse, millions of sperm go into the vagina. Only 1 sperm will penetrate and fertilize the female egg while it is in the Fallopian tube. One week later, the fertilized egg implants into the wall of the uterus. An embryo begins to develop into a baby. At 6 to 8 weeks, the eyes and face are formed and the heartbeat can be seen on ultrasound. At the end of 12 weeks (first trimester), all the baby's organs are formed. Now that you are pregnant, you will want to do everything you can to have a healthy baby. Two of the most important things are to get good prenatal care and follow your caregiver's instructions. Prenatal care is all the medical care you receive before the baby's birth. It is given to prevent, find, and treat problems during the pregnancy and childbirth. PRENATAL EXAMS  During prenatal visits, your weight, blood pressure and urine are checked. This is done to make sure you are healthy and progressing normally during the pregnancy.  A pregnant woman should gain 25 to 35 pounds during the pregnancy. However, if you are over weight or underweight, your caregiver will advise you regarding your weight.  Your caregiver will ask and answer questions for you.  Blood work, cervical cultures, other necessary tests and a Pap test are done during your prenatal exams. These tests are done to check on your health and the probable health of your baby. Tests are strongly recommended and done for HIV with your permission. This is the virus that causes AIDS. These tests are done because medications can be given to help prevent your baby from being born with this infection should you have been infected without knowing it. Blood work is also used to find out your blood type, previous infections and follow your blood levels (hemoglobin).  Low hemoglobin (anemia) is common during pregnancy. Iron and vitamins are given to help prevent this. Later in the pregnancy, blood  tests for diabetes will be done along with any other tests if any problems develop. You may need tests to make sure you and the baby are doing well.  You may need other tests to make sure you and the baby are doing well. CHANGES DURING THE FIRST TRIMESTER (THE FIRST 3 MONTHS OF PREGNANCY) Your body goes through many changes during pregnancy. They vary from person to person. Talk to your caregiver about changes you notice and are concerned about. Changes can include:  Your menstrual period stops.  The egg and sperm carry the genes that determine what you look like. Genes from you and your partner are forming a baby. The female genes determine whether the baby is a boy or a girl.  Your body increases in girth and you may feel bloated.  Feeling sick to your stomach (nauseous) and throwing up (vomiting). If the vomiting is uncontrollable, call your caregiver.  Your breasts will begin to enlarge and become tender.  Your nipples may stick out more and become darker.  The need to urinate more. Painful urination may mean you have a bladder infection.  Tiring easily.  Loss of appetite.  Cravings for certain kinds of food.  At first, you may gain or lose a couple of pounds.  You may have changes in your emotions from day to day (excited to be pregnant or concerned something may go wrong with the pregnancy and baby).  You may have more vivid and strange dreams. HOME CARE INSTRUCTIONS   It is very important   to avoid all smoking, alcohol and un-prescribed drugs during your pregnancy. These affect the formation and growth of the baby. Avoid chemicals while pregnant to ensure the delivery of a healthy infant.  Start your prenatal visits by the 12th week of pregnancy. They are usually scheduled monthly at first, then more often in the last 2 months before delivery. Keep your caregiver's appointments. Follow your caregiver's instructions regarding medication use, blood and lab tests, exercise, and  diet.  During pregnancy, you are providing food for you and your baby. Eat regular, well-balanced meals. Choose foods such as meat, fish, milk and other low fat dairy products, vegetables, fruits, and whole-grain breads and cereals. Your caregiver will tell you of the ideal weight gain.  You can help morning sickness by keeping soda crackers at the bedside. Eat a couple before arising in the morning. You may want to use the crackers without salt on them.  Eating 4 to 5 small meals rather than 3 large meals a day also may help the nausea and vomiting.  Drinking liquids between meals instead of during meals also seems to help nausea and vomiting.  A physical sexual relationship may be continued throughout pregnancy if there are no other problems. Problems may be early (premature) leaking of amniotic fluid from the membranes, vaginal bleeding, or belly (abdominal) pain.  Exercise regularly if there are no restrictions. Check with your caregiver or physical therapist if you are unsure of the safety of some of your exercises. Greater weight gain will occur in the last 2 trimesters of pregnancy. Exercising will help:  Control your weight.  Keep you in shape.  Prepare you for labor and delivery.  Help you lose your pregnancy weight after you deliver your baby.  Wear a good support or jogging bra for breast tenderness during pregnancy. This may help if worn during sleep too.  Ask when prenatal classes are available. Begin classes when they are offered.  Do not use hot tubs, steam rooms or saunas.  Wear your seat belt when driving. This protects you and your baby if you are in an accident.  Avoid raw meat, uncooked cheese, cat litter boxes and soil used by cats throughout the pregnancy. These carry germs that can cause birth defects in the baby.  The first trimester is a good time to visit your dentist for your dental health. Getting your teeth cleaned is OK. Use a softer toothbrush and brush  gently during pregnancy.  Ask for help if you have financial, counseling or nutritional needs during pregnancy. Your caregiver will be able to offer counseling for these needs as well as refer you for other special needs.  Do not take any medications or herbs unless told by your caregiver.  Inform your caregiver if there is any mental or physical domestic violence.  Make a list of emergency phone numbers of family, friends, hospital, and police and fire departments.  Write down your questions. Take them to your prenatal visit.  Do not douche.  Do not cross your legs.  If you have to stand for long periods of time, rotate you feet or take small steps in a circle.  You may have more vaginal secretions that may require a sanitary pad. Do not use tampons or scented sanitary pads. MEDICATIONS AND DRUG USE IN PREGNANCY  Take prenatal vitamins as directed. The vitamin should contain 1 milligram of folic acid. Keep all vitamins out of reach of children. Only a couple vitamins or tablets containing iron may be   fatal to a baby or young child when ingested.  Avoid use of all medications, including herbs, over-the-counter medications, not prescribed or suggested by your caregiver. Only take over-the-counter or prescription medicines for pain, discomfort, or fever as directed by your caregiver. Do not use aspirin, ibuprofen, or naproxen unless directed by your caregiver.  Let your caregiver also know about herbs you may be using.  Alcohol is related to a number of birth defects. This includes fetal alcohol syndrome. All alcohol, in any form, should be avoided completely. Smoking will cause low birth rate and premature babies.  Street or illegal drugs are very harmful to the baby. They are absolutely forbidden. A baby born to an addicted mother will be addicted at birth. The baby will go through the same withdrawal an adult does.  Let your caregiver know about any medications that you have to take  and for what reason you take them. MISCARRIAGE IS COMMON DURING PREGNANCY A miscarriage does not mean you did something wrong. It is not a reason to worry about getting pregnant again. Your caregiver will help you with questions you may have. If you have a miscarriage, you may need minor surgery. SEEK MEDICAL CARE IF:  You have any concerns or worries during your pregnancy. It is better to call with your questions if you feel they cannot wait, rather than worry about them. SEEK IMMEDIATE MEDICAL CARE IF:   An unexplained oral temperature above 102 F (38.9 C) develops, or as your caregiver suggests.  You have leaking of fluid from the vagina (birth canal). If leaking membranes are suspected, take your temperature and inform your caregiver of this when you call.  There is vaginal spotting or bleeding. Notify your caregiver of the amount and how many pads are used.  You develop a bad smelling vaginal discharge with a change in the color.  You continue to feel sick to your stomach (nauseated) and have no relief from remedies suggested. You vomit blood or coffee ground-like materials.  You lose more than 2 pounds of weight in 1 week.  You gain more than 2 pounds of weight in 1 week and you notice swelling of your face, hands, feet, or legs.  You gain 5 pounds or more in 1 week (even if you do not have swelling of your hands, face, legs, or feet).  You get exposed to German measles and have never had them.  You are exposed to fifth disease or chickenpox.  You develop belly (abdominal) pain. Round ligament discomfort is a common non-cancerous (benign) cause of abdominal pain in pregnancy. Your caregiver still must evaluate this.  You develop headache, fever, diarrhea, pain with urination, or shortness of breath.  You fall or are in a car accident or have any kind of trauma.  There is mental or physical violence in your home. Document Released: 10/13/2001 Document Revised: 01/11/2012  Document Reviewed: 04/16/2009 [employer] Patient Information 2013 [employer].  

## 2012-08-04 NOTE — Progress Notes (Signed)
Doing well.  Had u/s yesterday that showed IUGS but ? Yolk sac, no fetal pole.--will hold on Lovenox until viability is confirmed.  Pt. Without s/sx's of threatened Ab.

## 2012-08-04 NOTE — Progress Notes (Signed)
U/S scheduled 08/15/12 at 945 am.

## 2012-08-08 ENCOUNTER — Inpatient Hospital Stay (HOSPITAL_COMMUNITY)
Admission: AD | Admit: 2012-08-08 | Discharge: 2012-08-08 | Disposition: A | Discharge status: Hospice | Payer: Medicaid Other | Source: Ambulatory Visit | Attending: Obstetrics & Gynecology | Admitting: Obstetrics & Gynecology

## 2012-08-08 ENCOUNTER — Encounter (HOSPITAL_COMMUNITY): Payer: Self-pay | Admitting: *Deleted

## 2012-08-08 ENCOUNTER — Inpatient Hospital Stay (HOSPITAL_COMMUNITY): Payer: Medicaid Other

## 2012-08-08 DIAGNOSIS — O26859 Spotting complicating pregnancy, unspecified trimester: Secondary | ICD-10-CM | POA: Insufficient documentation

## 2012-08-08 DIAGNOSIS — O209 Hemorrhage in early pregnancy, unspecified: Secondary | ICD-10-CM

## 2012-08-08 HISTORY — DX: Disease of blood and blood-forming organs, unspecified: D75.9

## 2012-08-08 HISTORY — DX: Unspecified infectious disease: B99.9

## 2012-08-08 LAB — URINE MICROSCOPIC-ADD ON

## 2012-08-08 LAB — URINALYSIS, ROUTINE W REFLEX MICROSCOPIC
Bilirubin Urine: NEGATIVE
Ketones, ur: 15 mg/dL — AB
Nitrite: NEGATIVE
Protein, ur: NEGATIVE mg/dL
Urobilinogen, UA: 0.2 mg/dL (ref 0.0–1.0)

## 2012-08-08 NOTE — MAU Provider Note (Signed)
  History     CSN: [id number]  Arrival date and time: 08/08/12 1343   None     No chief complaint on file.  HPI  Arnetta Odeh is 31 y.o. [phone] [redacted]w[redacted]d weeks presenting with vaginal bleeding/spotting that began today.   She was seen in the [facility] clinic by Ivonne Andrew, CNM on 9/25.  Hx includes MTHFR gene mutation and stillborn.  On 9.28 U/S showed suspicion for nonviable pregnancy at [redacted]w[redacted]d--16.6mm IUGS with no definite Ys or embryo--which would have been expected by that date.  It did not meet criteria for failed IUP.   She is aware of the ultrasound results.  Reports she had one scheduled for next Monday to confirm.  BLOOD TYPE is A Positive.      Past Medical History  Diagnosis Date  . Asthma   . Spinal headache     Past Surgical History  Procedure Date  . Cesarean section   . Cholecystectomy   . Tonsillectomy and adenoidectomy     Family History  Problem Relation Age of Onset  . Hypertrophic cardiomyopathy Brother   . Heart disease Brother     Hypertrophic cadiomyopathy  . Cancer Mother 45    Ovarian    History  Substance Use Topics  . Smoking status: Never Smoker   . Smokeless tobacco: Never Used  . Alcohol Use: No     socially    Allergies:  Allergies  Allergen Reactions  . Almotriptan Malate Hives  . Cefprozil Hives  . Sulfa Drugs Cross Reactors     Prescriptions prior to admission  Medication Sig Dispense Refill  . acetaminophen (TYLENOL) 325 MG tablet Take 650 mg by mouth every 6 (six) hours as needed.      Marland Kitchen albuterol (PROVENTIL HFA;VENTOLIN HFA) 108 (90 BASE) MCG/ACT inhaler Inhale 2 puffs into the lungs every 6 (six) hours as needed.      . Prenatal Vit-Fe Fumarate-FA (TH PRENATAL VITAMINS) 28-0.8 MG TABS Take 1 tablet by mouth daily.  30 tablet  11    Review of Systems  Constitutional: Negative.   Respiratory: Negative.   Cardiovascular: Negative.   Gastrointestinal: Positive for abdominal pain (intermittent sharp flutterly pain). Negative for nausea  and vomiting.  Genitourinary:       + for vaginal spotting/bleeding   Physical Exam   Last menstrual period 05/29/2012.  Physical Exam  Constitutional: She appears well-developed and well-nourished. No distress.  Cardiovascular: Normal rate.   Respiratory: Effort normal.  GI: Soft. There is no tenderness.  Genitourinary: Uterus is not tender. Cervix exhibits no motion tenderness, no discharge and no friability. Right adnexum displays no tenderness. Left adnexum displays no tenderness. No tenderness or bleeding (negative for active bleeding) around the vagina. Vaginal discharge (small amount of brownish discharge) found.  Skin: Skin is warm and dry.  Psychiatric: She has a normal mood and affect. Her behavior is normal.    MAU Course  Procedures  MDM  Patient is aware of suspicion for non-viable pregnancy before today and now again today.  Will keep scheduled u/s. Assessment and Plan  A:  Vaginal spotting/bleeding in early pregnancy      Ultrasound suspicious for failed pregnancy but criteria not diagnostic  P:  Patient has an appt scheduled for next Monday for repeat ultrasound.  To keep that appt for ultrasound as planned.  Keep appt for [facility]         Daenerys Buttram,EVE M 08/08/2012, 2:06 PM

## 2012-08-08 NOTE — MAU Provider Note (Signed)
If patient has viable pregnancy, should go to high risk clinic.  If not viable, then pt should be given cytotec via MAU or through gyn clinic.  [facility]

## 2012-08-08 NOTE — MAU Note (Signed)
Had [place] last wk, showed empty sac, plan was to return next wk for [place] to check for progression.  Started spotting today.

## 2012-08-15 ENCOUNTER — Ambulatory Visit (HOSPITAL_COMMUNITY)
Admission: RE | Admit: 2012-08-15 | Discharge: 2012-08-15 | Disposition: A | Discharge status: Home / Self Care | Payer: Medicaid Other | Source: Ambulatory Visit | Attending: Family Medicine | Admitting: Family Medicine

## 2012-08-15 ENCOUNTER — Telehealth: Payer: Self-pay | Admitting: Family Medicine

## 2012-08-15 DIAGNOSIS — O209 Hemorrhage in early pregnancy, unspecified: Secondary | ICD-10-CM | POA: Insufficient documentation

## 2012-08-15 DIAGNOSIS — O09299 Supervision of pregnancy with other poor reproductive or obstetric history, unspecified trimester: Secondary | ICD-10-CM | POA: Insufficient documentation

## 2012-08-15 DIAGNOSIS — Z8759 Personal history of other complications of pregnancy, childbirth and the puerperium: Secondary | ICD-10-CM

## 2012-08-15 DIAGNOSIS — Z8751 Personal history of pre-term labor: Secondary | ICD-10-CM | POA: Insufficient documentation

## 2012-08-15 DIAGNOSIS — O021 Missed abortion: Secondary | ICD-10-CM | POA: Insufficient documentation

## 2012-08-15 NOTE — Telephone Encounter (Signed)
Trying to contact pt. To let her know results of u/s.  No significant change over 2 wk period.

## 2012-08-16 ENCOUNTER — Telehealth: Payer: Self-pay

## 2012-08-16 ENCOUNTER — Inpatient Hospital Stay (HOSPITAL_COMMUNITY)
Admission: AD | Admit: 2012-08-16 | Discharge: 2012-08-16 | Disposition: A | Discharge status: Home / Self Care | Payer: Medicaid Other | Source: Ambulatory Visit | Attending: Obstetrics & Gynecology | Admitting: Obstetrics & Gynecology

## 2012-08-16 ENCOUNTER — Encounter (HOSPITAL_COMMUNITY): Payer: Self-pay

## 2012-08-16 DIAGNOSIS — R109 Unspecified abdominal pain: Secondary | ICD-10-CM | POA: Insufficient documentation

## 2012-08-16 DIAGNOSIS — O09899 Supervision of other high risk pregnancies, unspecified trimester: Secondary | ICD-10-CM

## 2012-08-16 DIAGNOSIS — O0289 Other abnormal products of conception: Secondary | ICD-10-CM

## 2012-08-16 DIAGNOSIS — O021 Missed abortion: Secondary | ICD-10-CM

## 2012-08-16 LAB — CBC WITH DIFFERENTIAL/PLATELET
Eosinophils Absolute: 0.3 10*3/uL (ref 0.0–0.7)
HCT: 34.8 % — ABNORMAL LOW (ref 36.0–46.0)
Hemoglobin: 11.6 g/dL — ABNORMAL LOW (ref 12.0–15.0)
Lymphs Abs: 1.6 10*3/uL (ref 0.7–4.0)
MCH: 27.5 pg (ref 26.0–34.0)
Monocytes Absolute: 0.6 10*3/uL (ref 0.1–1.0)
Monocytes Relative: 7 % (ref 3–12)
Neutro Abs: 6.2 10*3/uL (ref 1.7–7.7)
Neutrophils Relative %: 71 % (ref 43–77)
RBC: 4.22 MIL/uL (ref 3.87–5.11)

## 2012-08-16 MED ORDER — MISOPROSTOL 200 MCG PO TABS: 200.0000 ug | ORAL_TABLET | Freq: Four times a day (QID) | ORAL | Status: DC

## 2012-08-16 MED ORDER — OXYCODONE-ACETAMINOPHEN 5-325 MG PO TABS: 1.0000 | ORAL_TABLET | Freq: Four times a day (QID) | ORAL | Status: DC | PRN

## 2012-08-16 NOTE — MAU Provider Note (Signed)
History     CSN: [id number]  Arrival date and time: 08/16/12 4098   First Provider Initiated Contact with Patient 08/16/12 1053      Chief Complaint  Patient presents with  . Vaginal Bleeding  . Abdominal Cramping   HPI Lisa Weaver is 31 y.o. G3P0201 [redacted]w[redacted]d weeks presenting with known non-viable pregnancy.  Had been seen in [facility].  Had outpatient ultrasound here yesterday, Dr. Shawnie Pons called her to discuss.  RN called her this am told to come in for cytotec.  Patient reports heavier bleeding with clots X 4 days.  Began with cramping last night, especially in her lower back.   Continues with bleeding. She agrees to Cytotec.    Past Medical History  Diagnosis Date  . Asthma   . Spinal headache   . Blood dyscrasia     MTHFR- genetic clotting issue  . Infection     urinary tract infection    Past Surgical History  Procedure Date  . Cesarean section   . Cholecystectomy   . Tonsillectomy and adenoidectomy     Family History  Problem Relation Age of Onset  . Hypertrophic cardiomyopathy Brother   . Heart disease Brother     Hypertrophic cadiomyopathy  . Cancer Mother 1    Ovarian  . Other Neg Hx     History  Substance Use Topics  . Smoking status: Never Smoker   . Smokeless tobacco: Never Used  . Alcohol Use: No     socially    Allergies:  Allergies  Allergen Reactions  . Almotriptan Malate Hives  . Cefprozil Hives  . Sulfa Drugs Cross Reactors Other (See Comments)    Childhood reaction    Prescriptions prior to admission  Medication Sig Dispense Refill  . ibuprofen (ADVIL,MOTRIN) 200 MG tablet Take 600 mg by mouth every 6 (six) hours as needed. Takes for pain      . albuterol (PROVENTIL HFA;VENTOLIN HFA) 108 (90 BASE) MCG/ACT inhaler Inhale 2 puffs into the lungs every 6 (six) hours as needed. For asthma        Review of Systems  Constitutional: Negative.   HENT: Negative.   Gastrointestinal: Positive for abdominal pain (cramping).    Genitourinary:       + vaginal bleeding   Physical Exam   Blood pressure 116/72, pulse 101, temperature 98.8 F (37.1 C), resp. rate 18, height 5' 6.25" (1.683 m), weight 107.412 kg (236 lb 12.8 oz), last menstrual period 05/29/2012, SpO2 100.00%, unknown if currently breastfeeding.  Physical Exam  Constitutional: She is oriented to person, place, and time. She appears well-developed and well-nourished. No distress.  HENT:  Head: Normocephalic.  Neck: Normal range of motion.  Neurological: She is alert and oriented to person, place, and time.  Skin: Skin is warm and dry.  Psychiatric: She has a normal mood and affect. Her behavior is normal.   Results for orders placed during the hospital encounter of 08/16/12 (from the past 24 hour(s))  CBC WITH DIFFERENTIAL     Status: Abnormal   Collection Time   08/16/12 11:20 AM      Component Value Range   WBC 8.8  4.0 - 10.5 K/uL   RBC 4.22  3.87 - 5.11 MIL/uL   Hemoglobin 11.6 (*) 12.0 - 15.0 g/dL   HCT 11.9 (*) 14.7 - 82.9 %   MCV 82.5  78.0 - 100.0 fL   MCH 27.5  26.0 - 34.0 pg   MCHC 33.3  30.0 -  36.0 g/dL   RDW 46.9  62.9 - 52.8 %   Platelets 264  150 - 400 K/uL   Neutrophils Relative 71  43 - 77 %   Neutro Abs 6.2  1.7 - 7.7 K/uL   Lymphocytes Relative 19  12 - 46 %   Lymphs Abs 1.6  0.7 - 4.0 K/uL   Monocytes Relative 7  3 - 12 %   Monocytes Absolute 0.6  0.1 - 1.0 K/uL   Eosinophils Relative 4  0 - 5 %   Eosinophils Absolute 0.3  0.0 - 0.7 K/uL   Basophils Relative 0  0 - 1 %   Basophils Absolute 0.0  0.0 - 0.1 K/uL   MAU Course  Procedures  MDM  11:58  Discussed with Dr. Macon Large patient's previous visits, findings of non viable pregnancy and HGB today.  Plan to treat with Cytotec and f/u in clinic  Assessment and Plan  A:  Non viable pregnancy by Ultrasound  P:  Here for Cytotec treatment.  Is a current patient in the clinic.  Will have her follow up in GYN in 2 weeks. Rx for Cytotec and Percocet for pain  control Discussed with the patient expectations of treatment    KEY,EVE M 08/16/2012, 11:01 AM

## 2012-08-16 NOTE — MAU Note (Signed)
Patient states she talked with Dr. Shawnie Pons and was instructed to come to MAU for treatment of missed AB/

## 2012-08-16 NOTE — MAU Note (Signed)
Patient states she was notified yesterday after an ultrasound she has a failed early pregnancy. Patient states she started bleeding on last Friday, getting progressively heavier and having abdominal and back cramping. States she has been changing pads about every hour but they are not saturated.

## 2012-08-16 NOTE — Telephone Encounter (Signed)
Pt called and stated that she was returning a call from yesterday about her [place] results.  Called pt and I informed her that it Dr. Shawnie Pons was attempting to call her and stated that there was no significant change over her two week period.  Pt informed me that she is having cramping and heavy bleeding and that she has changed pads at least 4 times.  Pt did say that she doesn't think that she would change pad as often its just that she doesn't like wearing pads. I advised pt that the symptoms that she is experiencing is different from her other visits and that I would advise her to go to MAU.  Pt asked if she was to go there what would they do.  I informed her that they would probably give her cytotec which would help expel the "products of conception".  She stated so that they may be faster.  I informed pt that it is a process but the cytotec would help if the pregnancy is not viable.  Pt stated understanding and had no further questions.  Pt did not say whether or not she would be going to MAU but she does have an appt scheduled at the clinics for Thursday, 08/18/12.

## 2012-08-16 NOTE — MAU Provider Note (Signed)
Attestation of Attending Supervision of Advanced Practitioner (CNM/NP): Evaluation and management procedures were performed by the Advanced Practitioner under my supervision and collaboration.  I have reviewed the Advanced Practitioner's note and chart, and I agree with the management and plan.  UGONNA  ANYANWU, MD, FACOG Attending Obstetrician & Gynecologist Faculty Practice, [facility]  

## 2012-08-18 ENCOUNTER — Encounter: Payer: Self-pay | Admitting: Advanced Practice Midwife

## 2012-08-31 ENCOUNTER — Encounter: Payer: Self-pay | Admitting: Obstetrics and Gynecology

## 2012-08-31 ENCOUNTER — Ambulatory Visit (INDEPENDENT_AMBULATORY_CARE_PROVIDER_SITE_OTHER): Payer: Medicaid Other | Admitting: Obstetrics and Gynecology

## 2012-08-31 VITALS — BP 113/78 | HR 98 | Temp 99.1°F | Ht 66.0 in | Wt 234.0 lb

## 2012-08-31 DIAGNOSIS — O021 Missed abortion: Secondary | ICD-10-CM

## 2012-08-31 NOTE — Addendum Note (Signed)
Addended by: Franchot Mimes on: 08/31/2012 01:21 PM   Modules accepted: Orders

## 2012-08-31 NOTE — Progress Notes (Signed)
Patient ID: Lisa Weaver, female   DOB: 01/17/1981, 31 y.o.   MRN: [id number] 31 yo G3P0201 with missed Ab at [redacted] weeks GA s/p cytotec on 10/15 presenting today for follow-up. Patient reports experiencing heavy bleeding with cramping a few days after taking cytotec. Her bleeding has now resolved. Patient is without any complaints.  Pelvic: normal vaginal mucosa and cervix. No abnormal bleeding and discharge. Small retroverted uterus, no palpable adnexal mass or tenderness   A/P 31 yo G3P0201 s/p cytotec for 8 week-missed Ab - Will check a quant HCG - Patient advised to continue taking prenatal vitamins. She is not actively seeking a pregnancy but also does not desire any birth control - RTC prn

## 2012-09-01 LAB — HCG, QUANTITATIVE, PREGNANCY: hCG, Beta Chain, Quant, S: 7.3 m[IU]/mL

## 2012-09-02 ENCOUNTER — Telehealth: Payer: Self-pay | Admitting: General Practice

## 2012-09-02 NOTE — Telephone Encounter (Signed)
Called patient and left message to give [place] a call back about appt information

## 2012-09-02 NOTE — Telephone Encounter (Signed)
Message copied by Kathee Delton on Fri Sep 02, 2012 10:06 AM ------      Message from: CONSTANT, PEGGY      Created: Thu Sep 01, 2012  2:27 PM       Please have patient return next week for repeat quant            Thanks      Peggy

## 2012-09-05 NOTE — Telephone Encounter (Signed)
Called patient and notified of lab appt. Patient stated understanding.

## 2012-09-07 ENCOUNTER — Other Ambulatory Visit: Payer: Self-pay

## 2012-09-07 DIAGNOSIS — O021 Missed abortion: Secondary | ICD-10-CM

## 2012-09-07 LAB — HCG, QUANTITATIVE, PREGNANCY: hCG, Beta Chain, Quant, S: 2.5 m[IU]/mL

## 2013-04-11 IMAGING — CT CT ABD-PELV W/ CM
2 of 4 series · 17 of 46 positions shown, 19 images · IV contrast (CONTRAST)
Comparison: None

CLINICAL DATA: Evaluate fever and elevated white blood cell count

CT ABDOMEN AND PELVIS WITH CONTRAST
TECHNIQUE: Multidetector CT imaging of the abdomen and pelvis was
performed following the standard protocol during bolus
administration of intravenous contrast.
Contrast: 160mL OMNIPAQUE IOHEXOL 300 MG/ML IV SOLN

[Series 2: routine · axial · 0.73mm/px · z∈[-488,-48]mm · 14 of 96 slices shown, 16 images]
[im 4/96  soft-tissue]
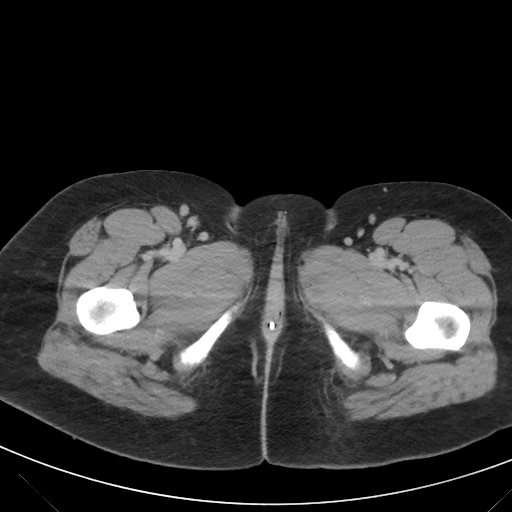
[im 4/96  bone]
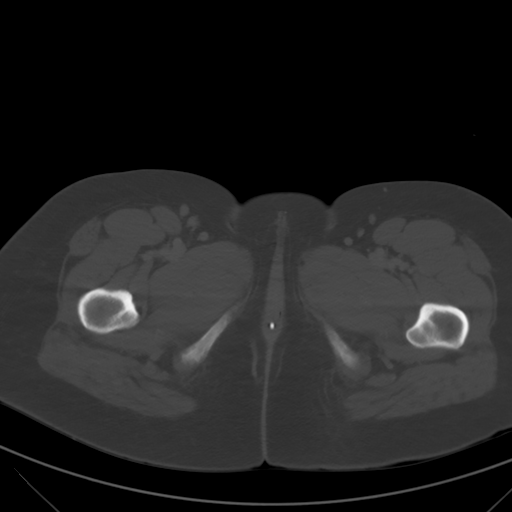
[im 11/96  soft-tissue]
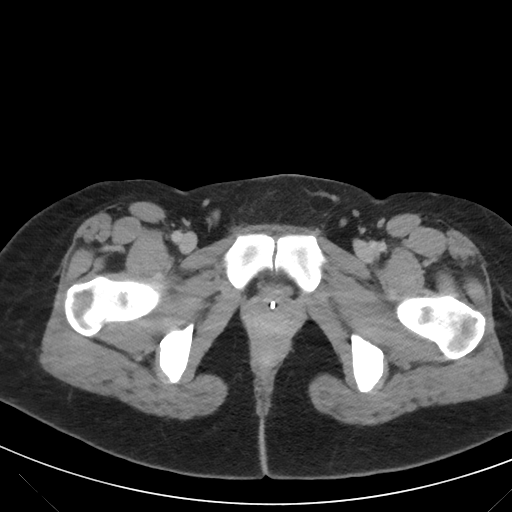
[im 18/96  soft-tissue]
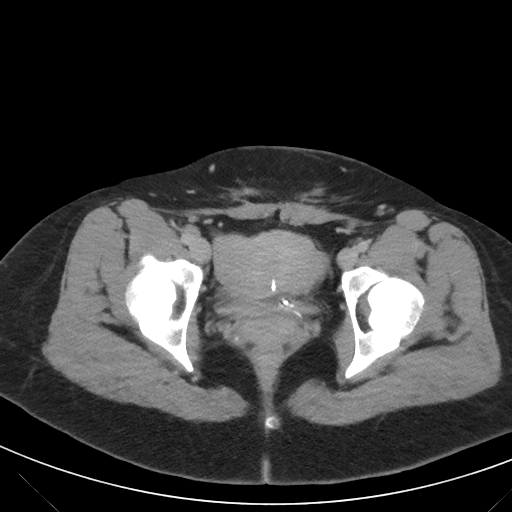
[im 25/96  soft-tissue]
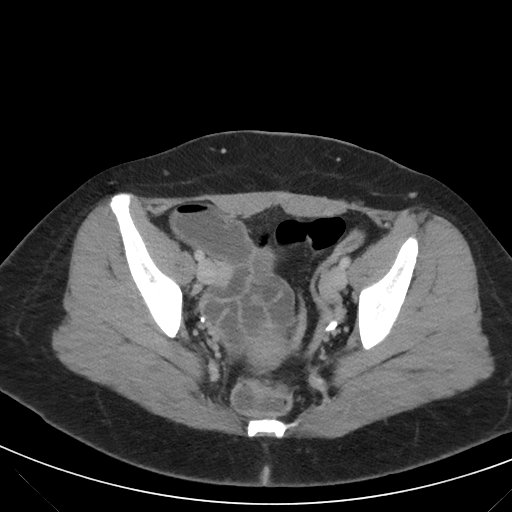
[im 32/96  soft-tissue]
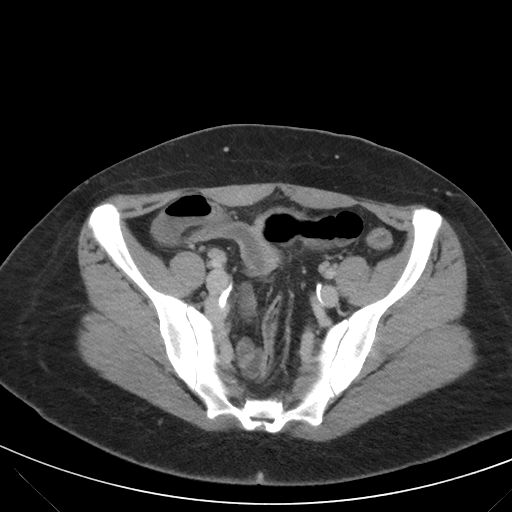
[im 39/96  soft-tissue]
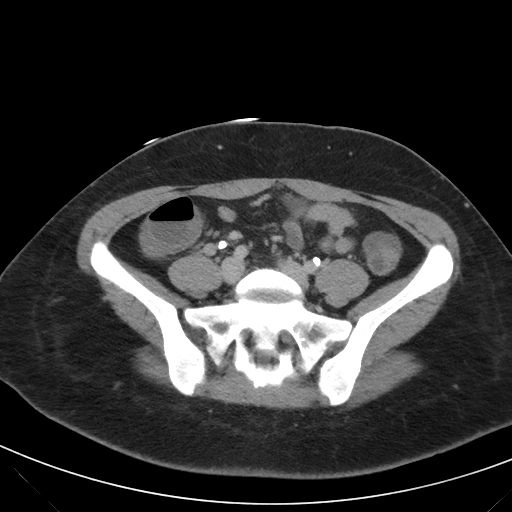
[im 46/96  soft-tissue]
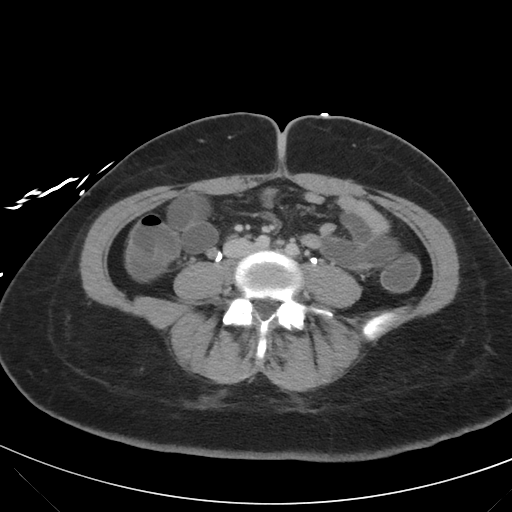
[im 50/96  soft-tissue]
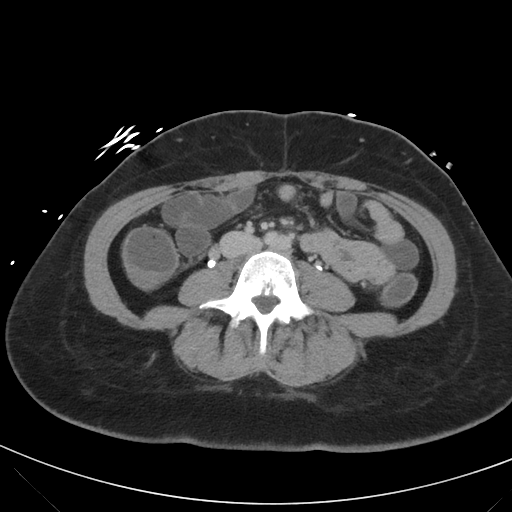
[im 57/96  soft-tissue]
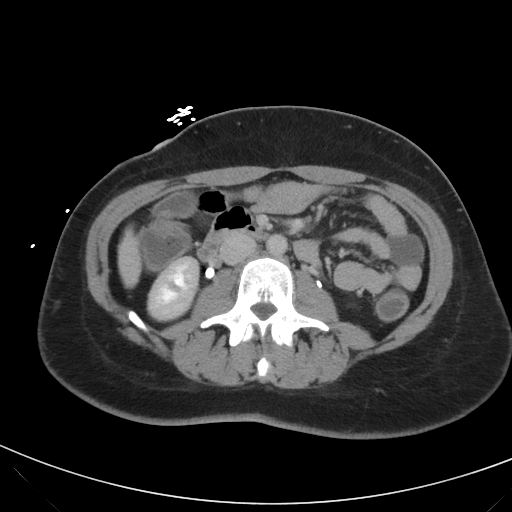
[im 57/96  bone]
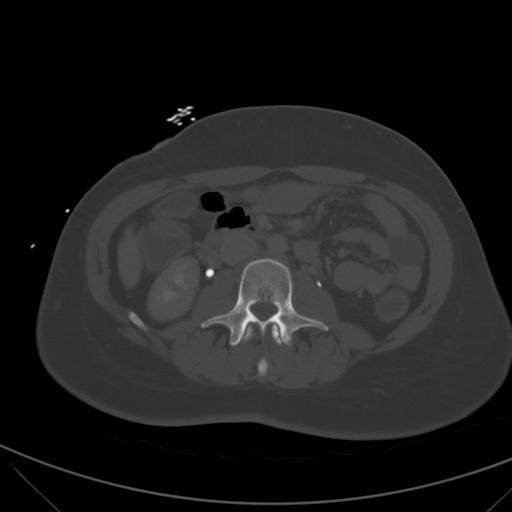
[im 64/96  soft-tissue]
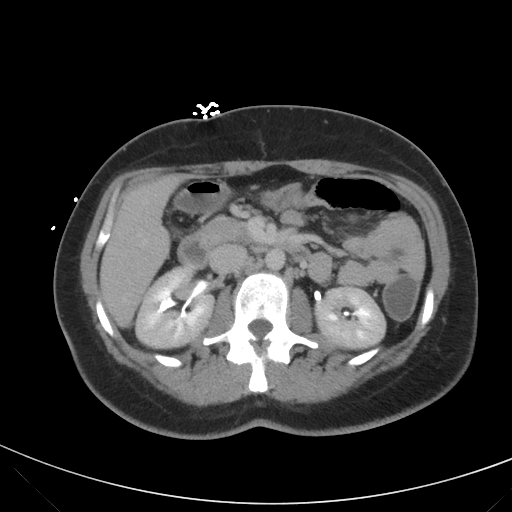
[im 71/96  soft-tissue]
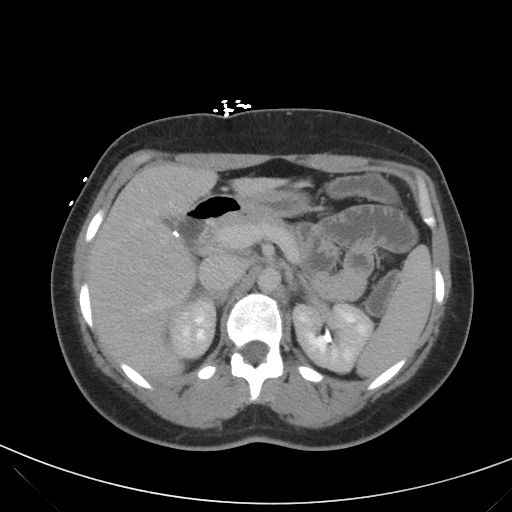
[im 78/96  soft-tissue]
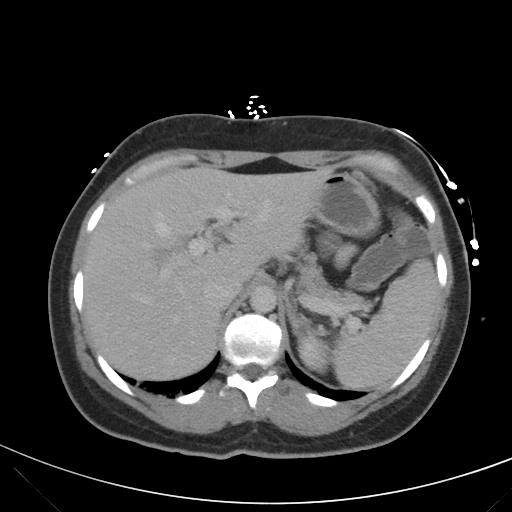
[im 85/96  soft-tissue]
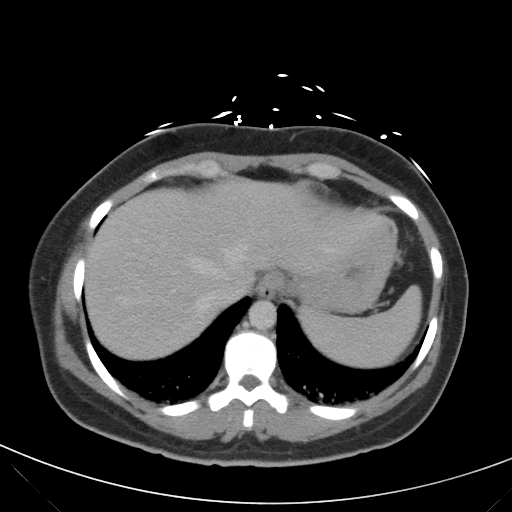
[im 92/96  soft-tissue]
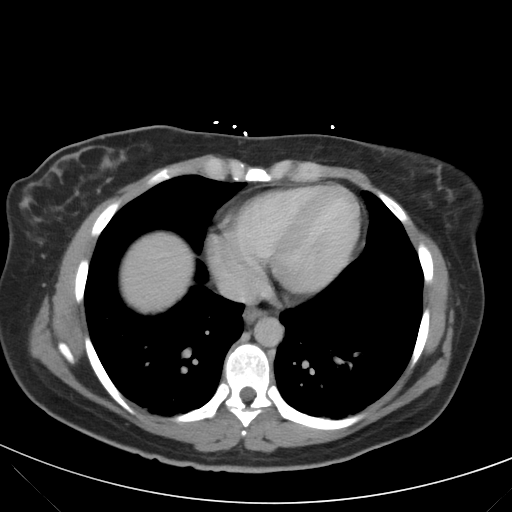

[mpr, coronals, coronal · coronal · 0.93mm/px · 3 of 103 slices shown]
[im 35/103  soft-tissue]
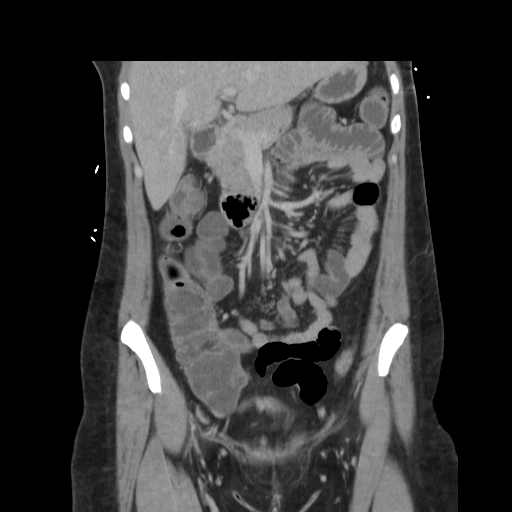
[im 46/103  soft-tissue]
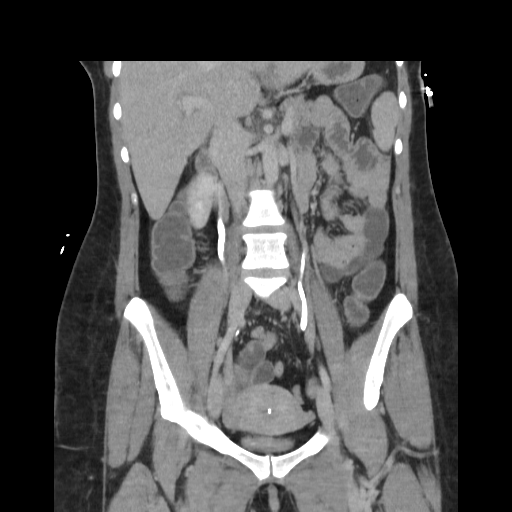
[im 57/103  soft-tissue]
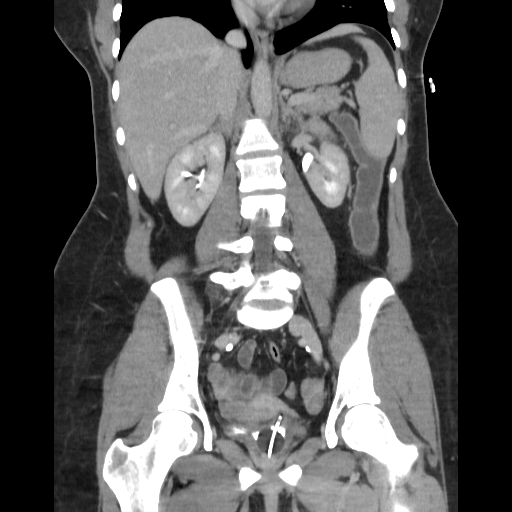

[17 of 46 positions shown; findings below may reference images not displayed]

FINDINGS: There is no focal liver abnormality.

Status post cholecystectomy identified.

No biliary dilatation.

The pancreas appears normal.

Spleen is negative.

Both adrenal glands are normal.

The pancreas appears normal.

Normal appearance of the kidneys.

There is no upper abdominal adenopathy.

No pelvic or inguinal adenopathy noted.

IUD within the uterine cavity.

Urinary bladder is collapsed around a Foley catheter balloon.

There is no free fluid or fluid collections within the abdomen or
the pelvis.

The stomach and the small bowel loops have a normal course and
caliber without evidence for obstruction.

The colon appears within normal limits.

The review of the visualized bony structures is unremarkable.
IMPRESSION: 1.  There are no acute findings identified within the abdomen or
pelvis.
2.  No findings to explain fever and elevated white count.

## 2013-08-28 IMAGING — US US PELVIS COMPLETE
1 series · 13 of 25 positions shown · non-contrast
Comparison: None.

CLINICAL DATA: Assess IUD location.  Unable to visualize string.
LMP 12/10/2011

TRANSABDOMINAL AND TRANSVAGINAL ULTRASOUND OF PELVIS
TECHNIQUE: Both transabdominal and transvaginal ultrasound
examinations of the pelvis were performed. Transabdominal technique
was performed for global imaging of the pelvis including uterus,
ovaries, adnexal regions, and pelvic cul-de-sac.

[Series 1: us transvaginal non-ob · 13 of 82 slices shown]
[im 1/82]
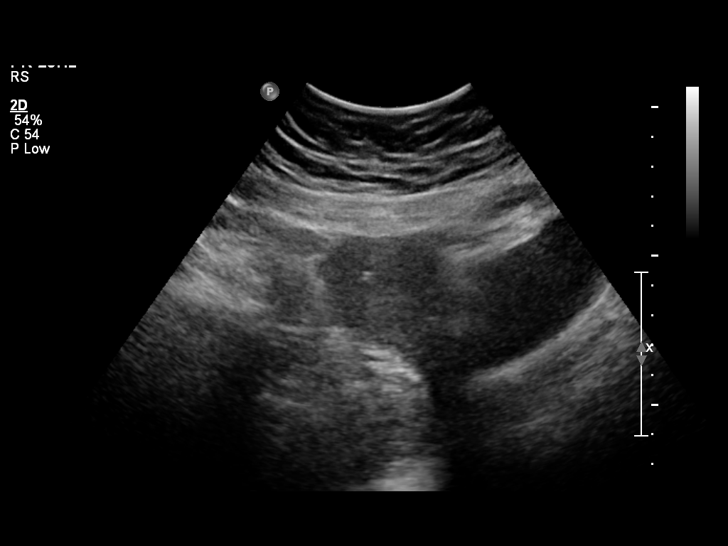
[im 7/82]
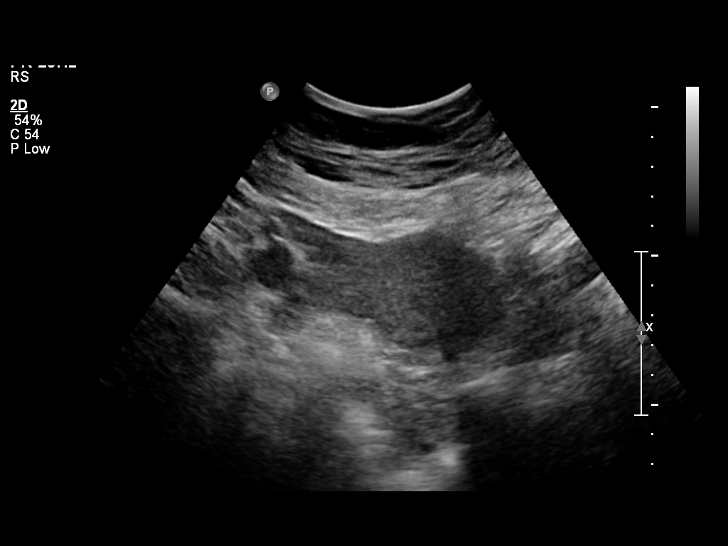
[im 14/82]
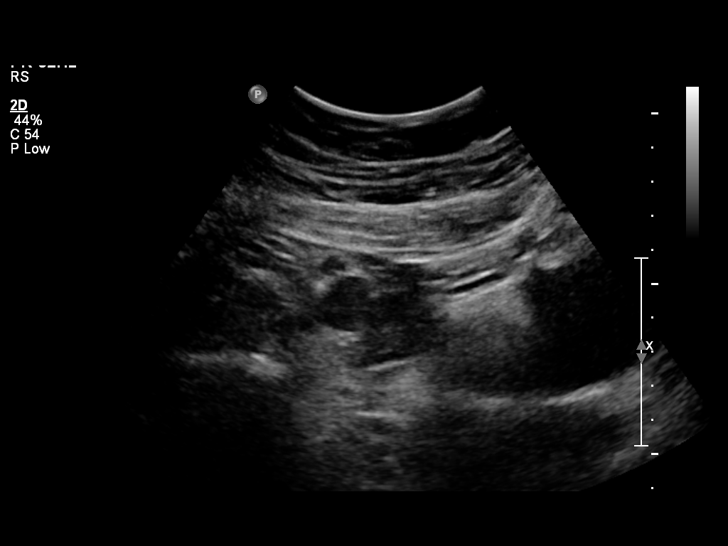
[im 21/82]
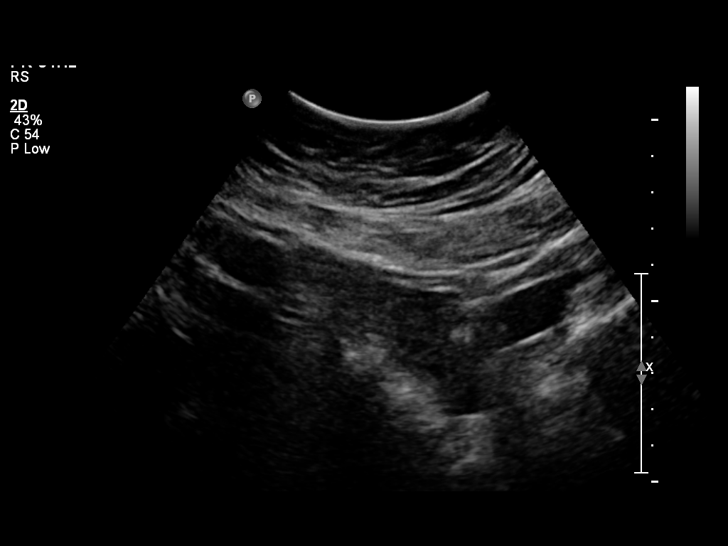
[im 28/82]
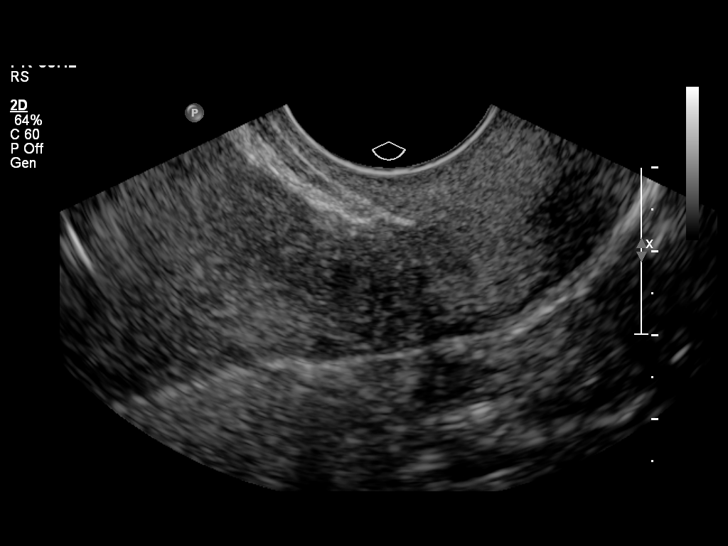
[im 34/82]
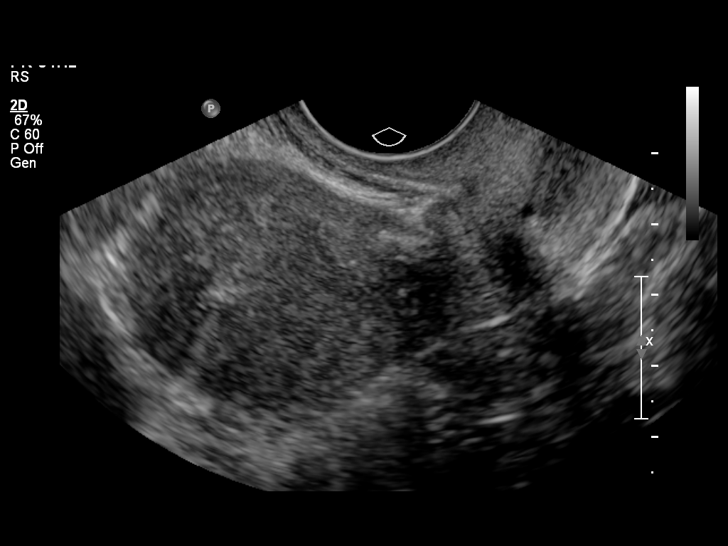
[im 41/82]
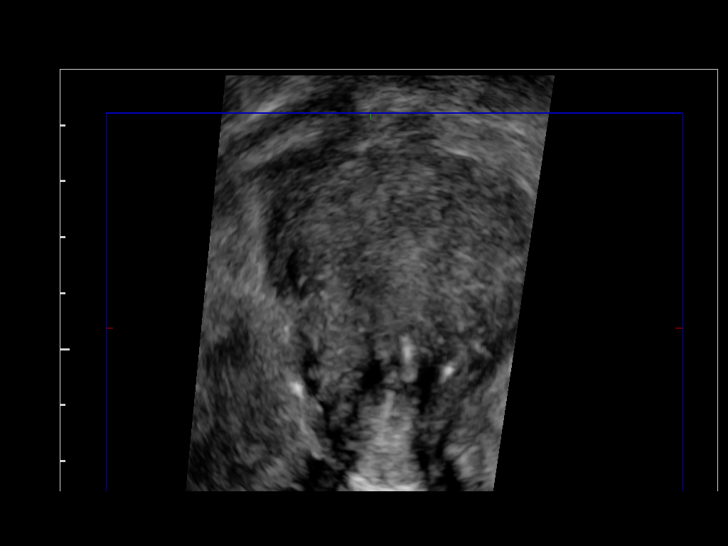
[im 48/82]
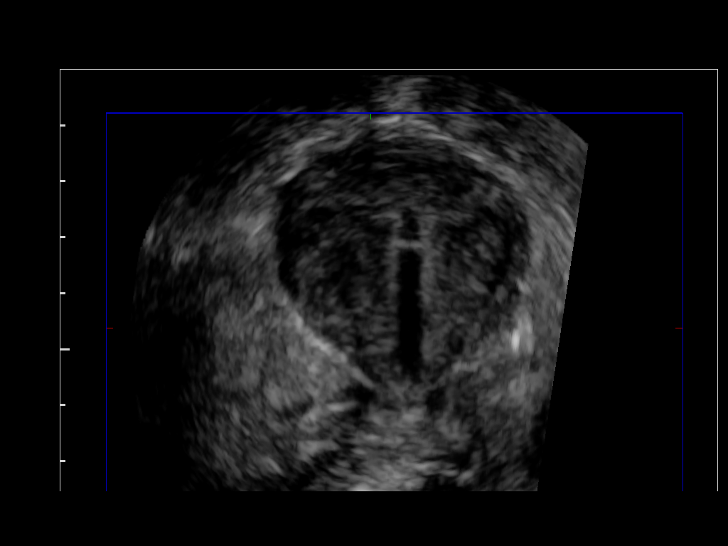
[im 55/82]
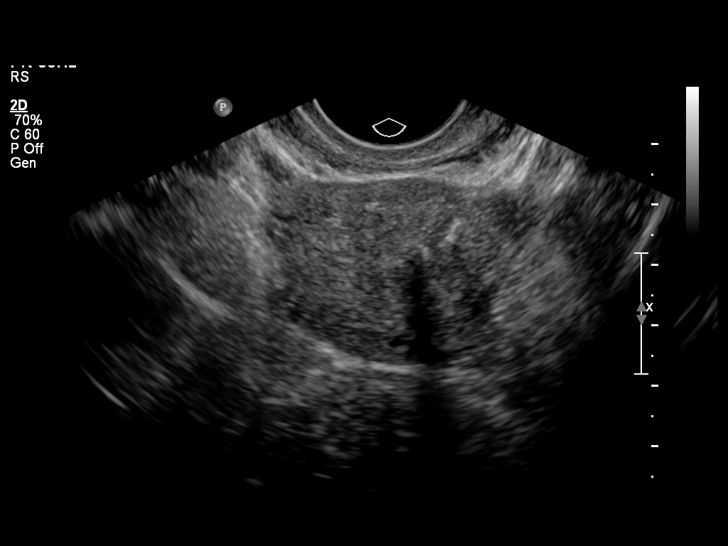
[im 61/82]
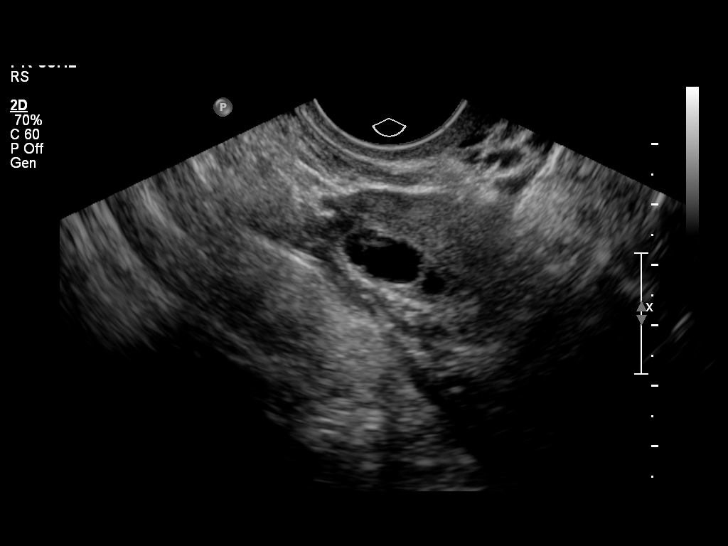
[im 68/82]
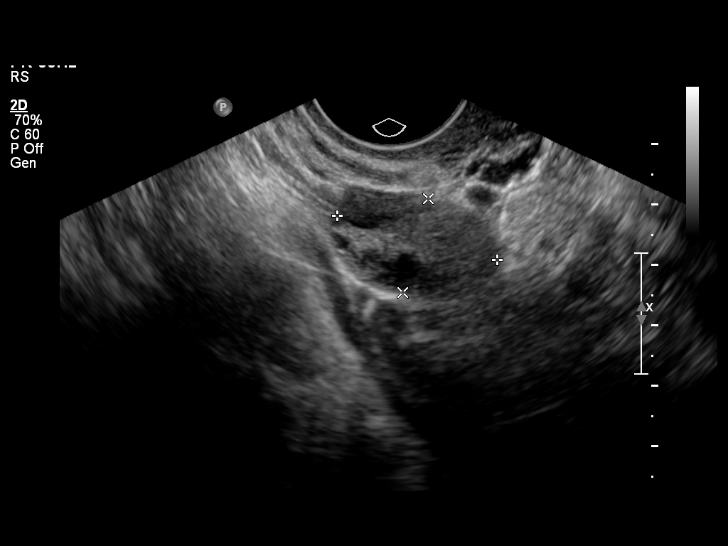
[im 75/82]
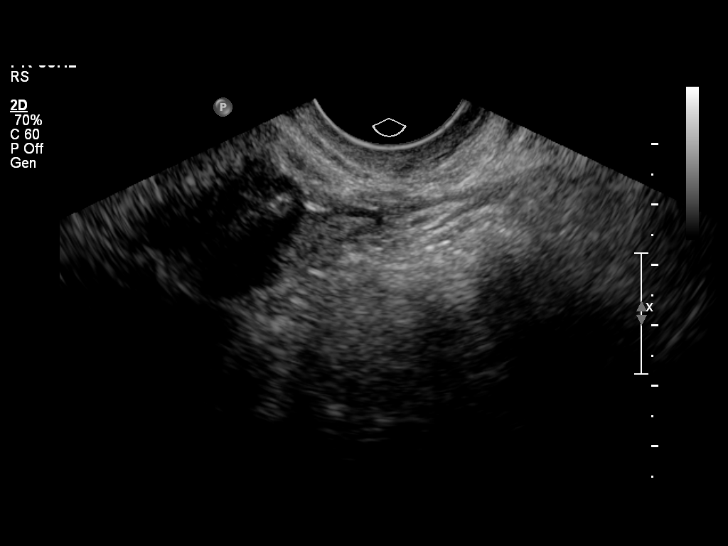
[im 82/82]
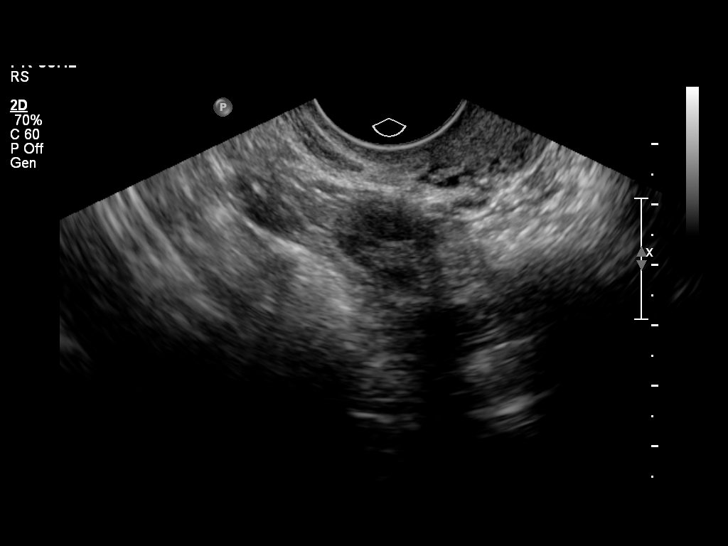

[13 of 25 positions shown; findings below may reference images not displayed]

It was necessary to proceed with endovaginal exam following the
transabdominal exam to visualize the endometrium and adnexa.
FINDINGS: Uterus: Measures 7.1 cm in length, 4.0 cm in depth and 4.6 cm in
width.  A homogeneous myometrium is seen.

Endometrium: The IUD appears appropriately positioned on coronal
reconstructed 3D views with no sign of myometrium penetration. The
lining is thin and echogenic measuring 3.7 mm, correlating with a
secretory endometrial stripe and with the given current menses. No
focal abnormality is seen.

Right ovary:  Measures 2.7 x 1.6 x 2.1 cm and appears normal.

Left ovary: Measures 2.6 x 1.5 x 2.3 cm and appears normal.

Other findings: No free fluid
IMPRESSION: Appropriate IUD position noted with 3 D imaging. Normal myometrium,
endometrium and ovaries..

## 2013-09-07 ENCOUNTER — Other Ambulatory Visit: Payer: Self-pay

## 2014-01-24 ENCOUNTER — Other Ambulatory Visit: Payer: Self-pay | Admitting: Obstetrics and Gynecology

## 2014-01-24 DIAGNOSIS — E049 Nontoxic goiter, unspecified: Secondary | ICD-10-CM

## 2014-01-26 ENCOUNTER — Ambulatory Visit
Admission: RE | Admit: 2014-01-26 | Discharge: 2014-01-26 | Disposition: A | Discharge status: Home / Self Care | Payer: BC Managed Care – PPO | Source: Ambulatory Visit | Attending: Obstetrics and Gynecology | Admitting: Obstetrics and Gynecology

## 2014-01-26 DIAGNOSIS — E049 Nontoxic goiter, unspecified: Secondary | ICD-10-CM

## 2014-04-25 ENCOUNTER — Encounter (HOSPITAL_BASED_OUTPATIENT_CLINIC_OR_DEPARTMENT_OTHER): Payer: Self-pay | Admitting: Emergency Medicine

## 2014-04-25 ENCOUNTER — Emergency Department (HOSPITAL_BASED_OUTPATIENT_CLINIC_OR_DEPARTMENT_OTHER)
Admission: EM | Admit: 2014-04-25 | Discharge: 2014-04-25 | Disposition: A | Discharge status: Home / Self Care | Payer: BC Managed Care – PPO | Attending: Emergency Medicine | Admitting: Emergency Medicine

## 2014-04-25 DIAGNOSIS — Z79899 Other long term (current) drug therapy: Secondary | ICD-10-CM | POA: Insufficient documentation

## 2014-04-25 DIAGNOSIS — Z862 Personal history of diseases of the blood and blood-forming organs and certain disorders involving the immune mechanism: Secondary | ICD-10-CM | POA: Insufficient documentation

## 2014-04-25 DIAGNOSIS — J45909 Unspecified asthma, uncomplicated: Secondary | ICD-10-CM | POA: Insufficient documentation

## 2014-04-25 DIAGNOSIS — I4949 Other premature depolarization: Secondary | ICD-10-CM | POA: Insufficient documentation

## 2014-04-25 DIAGNOSIS — Z8619 Personal history of other infectious and parasitic diseases: Secondary | ICD-10-CM | POA: Insufficient documentation

## 2014-04-25 DIAGNOSIS — Z8669 Personal history of other diseases of the nervous system and sense organs: Secondary | ICD-10-CM | POA: Insufficient documentation

## 2014-04-25 DIAGNOSIS — I493 Ventricular premature depolarization: Secondary | ICD-10-CM

## 2014-04-25 DIAGNOSIS — Z8639 Personal history of other endocrine, nutritional and metabolic disease: Secondary | ICD-10-CM | POA: Insufficient documentation

## 2014-04-25 HISTORY — DX: Genetic susceptibility to other disease: Z15.89

## 2014-04-25 HISTORY — DX: Methylenetetrahydrofolate reductase deficiency: E72.12

## 2014-04-25 NOTE — ED Provider Notes (Signed)
CSN: [id number]     Arrival date & time 04/25/14  1347 History   First MD Initiated Contact with Patient 04/25/14 1353     Chief Complaint  Patient presents with  . Palpitations      HPI  Patient presents with a sensation of irregular heartbeats. She states she feels "fluttering" that lasts for a second or 2. Is not associated with shortness or breath, lightheadedness, pain, or other symptoms. He been intermittently present over a few months. Worse over the last few days. She's had a mild headache last 2-3 days. She taken ibuprofen today. Her last 2 days she's taken one dose per day of Excedrin Migraine. No excessive caffeine today. Does use an albuterol inhaler, has not used over the last few days. History of CHF or mutation. Diagnosed after a stillbirth. Has a brother that died in a car accident, but postmortem showed a dilated cardiomyopathy.  Past Medical History  Diagnosis Date  . Asthma   . Spinal headache   . Blood dyscrasia     MTHFR- genetic clotting issue  . Infection     urinary tract infection  . MTHFR mutation    Past Surgical History  Procedure Laterality Date  . Cesarean section    . Cholecystectomy    . Tonsillectomy and adenoidectomy     Family History  Problem Relation Age of Onset  . Hypertrophic cardiomyopathy Brother   . Heart disease Brother     Hypertrophic cadiomyopathy  . Cancer Mother [id number]    Ovarian  . Other Neg Hx    History  Substance Use Topics  . Smoking status: Never Smoker   . Smokeless tobacco: Never Used  . Alcohol Use: No     Comment: socially   OB History   Grav Para Term Preterm Abortions TAB SAB Ect Mult Living   3 2 0 2 1 0 1 0 0 1      Review of Systems  Constitutional: Negative for fever, chills, diaphoresis, appetite change and fatigue.  HENT: Negative for mouth sores, sore throat and trouble swallowing.   Eyes: Negative for visual disturbance.  Respiratory: Negative for cough, chest tightness, shortness of breath and  wheezing.   Cardiovascular: Positive for palpitations. Negative for chest pain.  Gastrointestinal: Negative for nausea, vomiting, abdominal pain, diarrhea and abdominal distention.  Endocrine: Negative for polydipsia, polyphagia and polyuria.  Genitourinary: Negative for dysuria, frequency and hematuria.  Musculoskeletal: Negative for gait problem.  Skin: Negative for color change, pallor and rash.  Neurological: Negative for dizziness, syncope, light-headedness and headaches.  Hematological: Does not bruise/bleed easily.  Psychiatric/Behavioral: Negative for behavioral problems and confusion.      Allergies  Almotriptan malate; Cefprozil; and Sulfa drugs cross reactors  Home Medications   Prior to Admission medications   Medication Sig Start Date End Date Taking? Authorizing Provider  albuterol (PROVENTIL HFA;VENTOLIN HFA) 108 (90 BASE) MCG/ACT inhaler Inhale 2 puffs into the lungs every 6 (six) hours as needed. For asthma    Historical Provider, MD  ibuprofen (ADVIL,MOTRIN) 200 MG tablet Take 600 mg by mouth every 6 (six) hours as needed. Takes for pain    Historical Provider, MD  misoprostol (CYTOTEC) 200 MCG tablet Take 1 tablet (200 mcg total) by mouth 4 (four) times daily. PLACE ALL 4 TABLETS IN THE VAGINA 08/16/12   Verita SchneidersEvelyn M Key, NP  oxyCODONE-acetaminophen (PERCOCET/ROXICET) 5-325 MG per tablet Take 1 tablet by mouth every 6 (six) hours as needed for pain. 08/16/12   Verita SchneidersEvelyn M  Key, NP   BP 137/91  Pulse 100  Temp(Src) 98 F (36.7 C)  Resp 18  Ht 5\' 6"  (1.676 m)  Wt 230 lb (104.327 kg)  BMI 37.14 kg/m2  SpO2 98%  LMP 04/20/2014 Physical Exam  Constitutional: She is oriented to person, place, and time. She appears well-developed and well-nourished. No distress.  HENT:  Head: Normocephalic.  Eyes: Conjunctivae are normal. Pupils are equal, round, and reactive to light. No scleral icterus.  Neck: Normal range of motion. Neck supple. No thyromegaly present.   Cardiovascular: Normal rate and regular rhythm.   Occasional extrasystoles are present. Exam reveals no gallop and no friction rub.   No murmur heard. Patient has PVCs do perfuse as I palpate her pulse. They're visible on the monitor, unifocal. No coupling or runs for salvos. No murmurs.  Pulmonary/Chest: Effort normal and breath sounds normal. No respiratory distress. She has no wheezes. She has no rales.  Abdominal: Soft. Bowel sounds are normal. She exhibits no distension. There is no tenderness. There is no rebound.  Musculoskeletal: Normal range of motion.  Neurological: She is alert and oriented to person, place, and time.  Skin: Skin is warm and dry. No rash noted.  Psychiatric: She has a normal mood and affect. Her behavior is normal.    ED Course  Procedures (including critical care time) Labs Review Labs Reviewed - No data to display  Imaging Review No results found.   EKG Interpretation   Date/Time:  Wednesday April 25 2014 14:04:14 EDT Ventricular Rate:  79 PR Interval:  180 QRS Duration: 76 QT Interval:  406 QTC Calculation: 465 R Axis:   58 Text Interpretation:  Normal sinus rhythm Normal ECG Confirmed by Fayrene FearingJAMES   MD, MARK ([id number]) on 04/25/2014 2:37:26 PM      MDM   Final diagnoses:  PVCs (premature ventricular contractions)    Benign PVCs. No excessive caffeine. No excessive albuterol. Over-the-counter cough or cold medicines or stimulants. Denies IV drug use. No pain. Think is very likely simple benign PVCs. With her brothers history, I have given her a Cardiology referral.. Recheck here with any worsening symptoms.    Rolland PorterMark James, MD 04/25/14 1504

## 2014-04-25 NOTE — ED Notes (Signed)
Pt c/o "palpatations on and off" x 2 weeks denies SOB

## 2014-04-25 NOTE — Discharge Instructions (Signed)
Premature Ventricular Contraction Premature ventricular contraction (PVC) is an irregularity of the heart rhythm involving extra or skipped heartbeats. In some cases, they may occur without obvious cause or heart disease. Other times, they can be caused by an electrolyte change in the blood. These need to be corrected. They can also be seen when there is not enough oxygen going to the heart. A common cause of this is plaque or cholesterol buildup. This buildup decreases the blood supply to the heart. In addition, extra beats may be caused or aggravated by:  Excessive smoking.  Alcohol consumption.  Caffeine.  Certain medications  Some street drugs. SYMPTOMS   The sensation of feeling your heart skipping a beat (palpitations).  In many cases, the person may have no symptoms. SIGNS AND TESTS   A physical examination may show an occasional irregularity, but if the PVC beats do not happen often, they may not be found on physical exam.  Blood pressure is usually normal.  Other tests that may find extra beats of the heart are:  An EKG (electrocardiogram)  A Holter monitor which can monitor your heart over longer periods of time  An Angiogram (study of the heart arteries). TREATMENT  Usually extra heartbeats do not need treatment. The condition is treated only if symptoms are severe or if extra beats are very frequent or are causing problems. An underlying cause, if discovered, may also require treatment.  Treatment may also be needed if there may be a risk for other more serious cardiac arrhythmias.  PREVENTION   Moderation in caffeine, alcohol, and tobacco use may reduce the risk of ectopic heartbeats in some people.  Exercise often helps people who lead a sedentary (inactive) lifestyle. PROGNOSIS  PVC heartbeats are generally harmless and do not need treatment.  RISKS AND COMPLICATIONS   Ventricular tachycardia (occasionally).  There usually are no complications.  Other  arrhythmias (occasionally). SEEK IMMEDIATE MEDICAL CARE IF:   You feel palpitations that are frequent or continual.  You develop chest pain or other problems such as shortness of breath, sweating, or nausea and vomiting.  You become light-headed or faint (pass out).  You get worse or do not improve with treatment. Document Released: 06/05/2004 Document Revised: 01/11/2012 Document Reviewed: 12/16/2007 Exit[employer] Patient Information 2015 [employer]. This information is not intended to replace advice given to you by your health care provider. Make sure you discuss any questions you have with your health care provider.  

## 2014-05-03 ENCOUNTER — Ambulatory Visit (INDEPENDENT_AMBULATORY_CARE_PROVIDER_SITE_OTHER): Payer: BC Managed Care – PPO | Admitting: Cardiology

## 2014-05-03 ENCOUNTER — Encounter: Payer: Self-pay | Admitting: Cardiology

## 2014-05-03 VITALS — BP 125/84 | HR 85 | Ht 66.0 in | Wt 233.0 lb

## 2014-05-03 DIAGNOSIS — R002 Palpitations: Secondary | ICD-10-CM

## 2014-05-03 DIAGNOSIS — Z1589 Genetic susceptibility to other disease: Secondary | ICD-10-CM

## 2014-05-03 DIAGNOSIS — I493 Ventricular premature depolarization: Secondary | ICD-10-CM | POA: Insufficient documentation

## 2014-05-03 DIAGNOSIS — E7210 Disorders of sulfur-bearing amino-acid metabolism, unspecified: Secondary | ICD-10-CM

## 2014-05-03 DIAGNOSIS — E7212 Methylenetetrahydrofolate reductase deficiency: Secondary | ICD-10-CM

## 2014-05-03 DIAGNOSIS — Z8249 Family history of ischemic heart disease and other diseases of the circulatory system: Secondary | ICD-10-CM | POA: Insufficient documentation

## 2014-05-03 DIAGNOSIS — I4949 Other premature depolarization: Secondary | ICD-10-CM

## 2014-05-03 NOTE — Progress Notes (Signed)
Patient ID: Lisa Weaver, female   DOB: 1981/09/17, 33 y.o.   MRN: [id number]    Patient Name: Lisa SellsJamie Weaver Date of Encounter: 05/03/2014  Primary Care Provider:  Pcp Not In System Primary Cardiologist:  Lars MassonNELSON, Armstrong Creasy H  Problem List   Past Medical History  Diagnosis Date  . Asthma   . Spinal headache   . Blood dyscrasia     MTHFR- genetic clotting issue  . Infection     urinary tract infection  . MTHFR mutation    Past Surgical History  Procedure Laterality Date  . Cesarean section    . Cholecystectomy    . Tonsillectomy and adenoidectomy     Allergies  Allergies  Allergen Reactions  . Almotriptan Malate Hives  . Cefprozil Hives  . Sulfa Drugs Cross Reactors Other (See Comments)    Childhood reaction    HPI A pleasant 33 year old female with MTHFR gene mutation for clotting abnormality discovered during her first pregnancy that ended in stillbirth at 32 weeks. The patient was on Lovenox during her second pregnancy. She recently went to the ER for a few second lasting palpitations that have been rate during all day very uncomfortable for her. This has been going on for last couple months. She denies any associated shortness of breath or dizziness. She denies any prior syncopal episode other one occasion when she was septic.  The patient states that her brother died in the motor vehicle accident and in patency was found to have hypertrophic cardiomyopathy. Her grandmother died of congestive heart failure. Her mother underwent genetic testing and was negative for hypertrophic cardiomyopathy. The patient underwent echocardiographic in 2010 and it was not suggestive of hypertrophic cardiomyopathy.  Home Medications  Prior to Admission medications   Medication Sig Start Date End Date Taking? Authorizing Provider  albuterol (PROVENTIL HFA;VENTOLIN HFA) 108 (90 BASE) MCG/ACT inhaler Inhale 2 puffs into the lungs every 6 (six) hours as needed. For asthma   Yes Historical  Provider, MD  ibuprofen (ADVIL,MOTRIN) 200 MG tablet Take 600 mg by mouth every 6 (six) hours as needed. Takes for pain   Yes Historical Provider, MD   Family History  Family History  Problem Relation Age of Onset  . Hypertrophic cardiomyopathy Brother   . Heart disease Brother     Hypertrophic cadiomyopathy  . Cancer Mother [id number]    Ovarian  . Other Neg Hx     Social History  History   Social History  . Marital Status: Single    Spouse Name: N/A    Number of Children: N/A  . Years of Education: N/A   Occupational History  . Not on file.   Social History Main Topics  . Smoking status: Never Smoker   . Smokeless tobacco: Never Used  . Alcohol Use: No     Comment: socially  . Drug Use: No  . Sexual Activity: Yes    Birth Control/ Protection: None   Other Topics Concern  . Not on file   Social History Narrative  . No narrative on file     Review of Systems, as per HPI, otherwise negative General:  No chills, fever, night sweats or weight changes.  Cardiovascular:  No chest pain, dyspnea on exertion, edema, orthopnea, palpitations, paroxysmal nocturnal dyspnea. Dermatological: No rash, lesions/masses Respiratory: No cough, dyspnea Urologic: No hematuria, dysuria Abdominal:   No nausea, vomiting, diarrhea, bright red blood per rectum, melena, or hematemesis Neurologic:  No visual changes, wkns, changes in mental status. All other systems  reviewed and are otherwise negative except as noted above.  Physical Exam  Blood pressure 125/84, pulse 85, height 5\' 6"  (1.676 m), weight 233 lb (105.688 kg), last menstrual period 04/20/2014, unknown if currently breastfeeding.  General: Pleasant, NAD Psych: Normal affect. Neuro: Alert and oriented X 3. Moves all extremities spontaneously. HEENT: Normal  Neck: Supple without bruits or JVD. Lungs:  Resp regular and unlabored, CTA. Heart: RRR no s3, s4, or murmurs. Abdomen: Soft, non-tender, non-distended, BS + x 4.    Extremities: No clubbing, cyanosis or edema. DP/PT/Radials 2+ and equal bilaterally.  Labs:  Lab Results  Component Value Date   WBC 8.8 08/16/2012   HGB 11.6* 08/16/2012   HCT 34.8* 08/16/2012   MCV 82.5 08/16/2012   PLT 264 08/16/2012    No results found for this basename: DDIMER   No components found with this basename: POCBNP,     Component Value Date/Time   NA 142 07/29/2011 0540   K 3.3* 07/29/2011 0540   CL 110 07/29/2011 0540   CO2 22 07/29/2011 0540   GLUCOSE 101* 07/29/2011 0540   BUN 9 07/29/2011 0540   CREATININE 1.03 07/29/2011 0540   CALCIUM 9.2 07/29/2011 0540   PROT 5.5* 07/26/2011 0337   ALBUMIN 2.6* 07/26/2011 0337   AST 44* 07/26/2011 0337   ALT 33 07/26/2011 0337   ALKPHOS 71 07/26/2011 0337   BILITOT 0.6 07/26/2011 0337   GFRNONAA >60 07/29/2011 0540   GFRAA >60 07/29/2011 0540   No results found for this basename: CHOL, HDL, LDLCALC, TRIG    Accessory Clinical Findings  Echocardiogram - none  ECG - NSR, normal ECG    Assessment & Plan  A very pleasant 33 year old female with frequent symptomatic PVCs and significant family history of hypertrophic cardiomyopathy. We will repeat echocardiogram since it has been 5 years since the last one an HCM can present as late as early 30s. We will also perform 48 hour Holter monitor to rule out any more significant arrhythmias such as nonsustained VT's. Patient states that she was recently tested for thyroid disease) normal.  If the results normal we will follow in 1 year.  Lars MassonNELSON, Viviana Trimble H, MD, [facility] 05/03/2014, 3:12 PM

## 2014-05-03 NOTE — Patient Instructions (Addendum)
Your physician recommends that you continue on your current medications as directed. Please refer to the Current Medication list given to you today.  Your physician has requested that you have an echocardiogram. Echocardiography is a painless test that uses sound waves to create images of your heart. It provides your doctor with information about the size and shape of your heart and how well your heart's chambers and valves are working. This procedure takes approximately one hour. There are no restrictions for this procedure.   Your physician has recommended that you wear a 48 HOUR holter monitor. Holter monitors are medical devices that record the heart's electrical activity. Doctors most often use these monitors to diagnose arrhythmias. Arrhythmias are problems with the speed or rhythm of the heartbeat. The monitor is a small, portable device. You can wear one while you do your normal daily activities. This is usually used to diagnose what is causing palpitations/syncope (passing out).     Your physician wants you to follow-up in: ONE YEAR WITH DR Johnell ComingsNELSON You will receive a reminder letter in the mail two months in advance. If you don't receive a letter, please call our office to schedule the follow-up appointment.

## 2014-05-04 IMAGING — US US OB TRANSVAGINAL
1 series · 13 of 28 positions shown · non-contrast
Comparison: none

[Series 1: us ob transvaginal · 30 acquisitions, 13 frames shown]
[im 2/30]
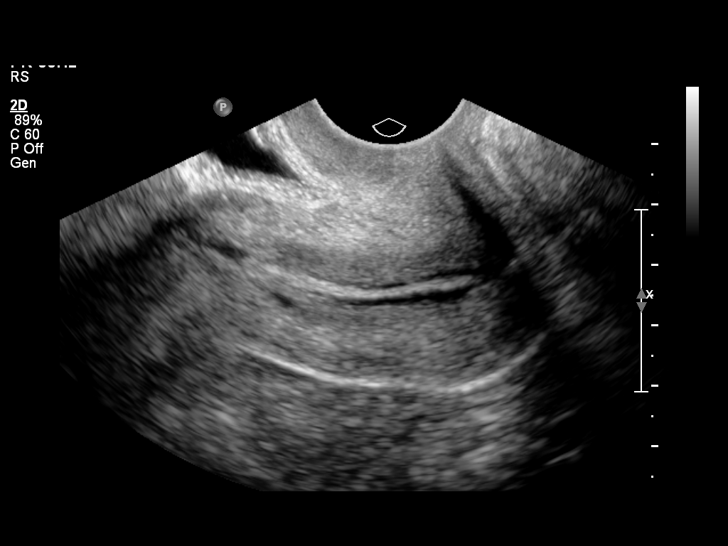
[im 4/30]
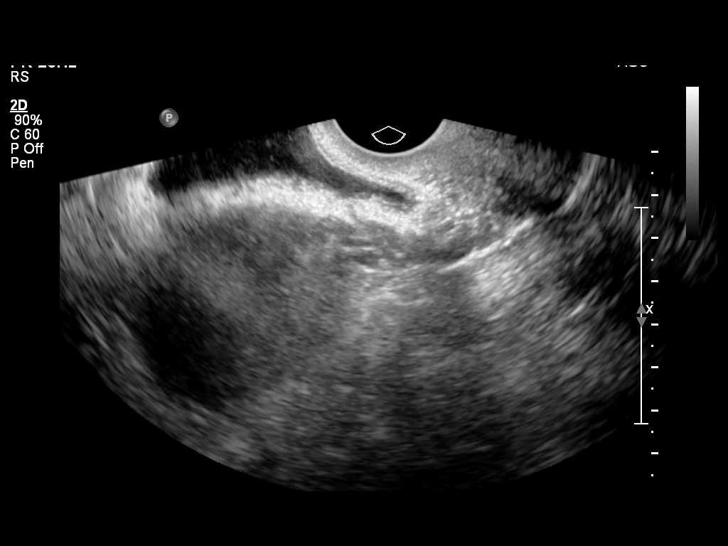
[im 6/30]
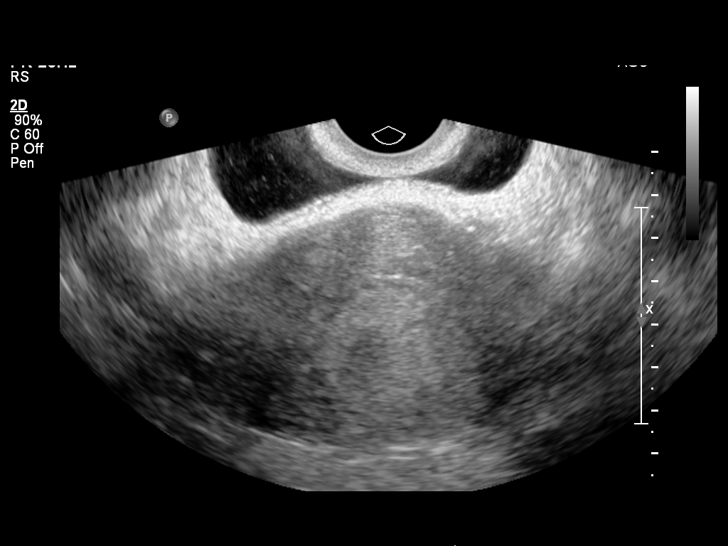
[im 8/30]
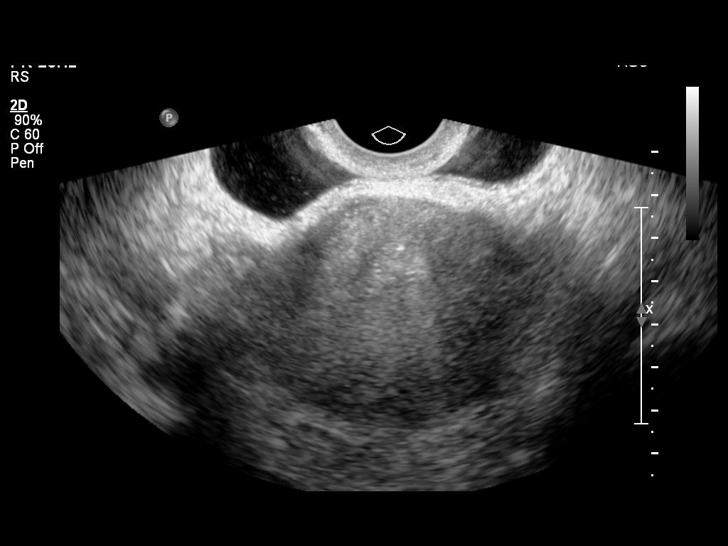
[im 10/30]
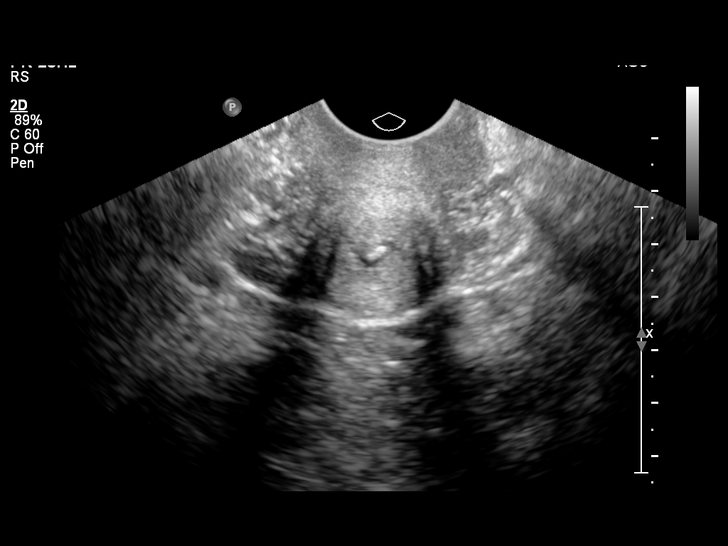
[im 12/30]
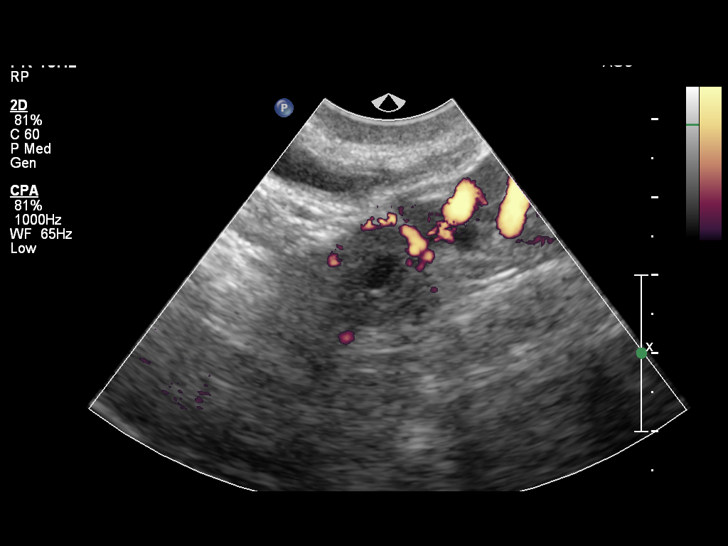
[im 16/30]
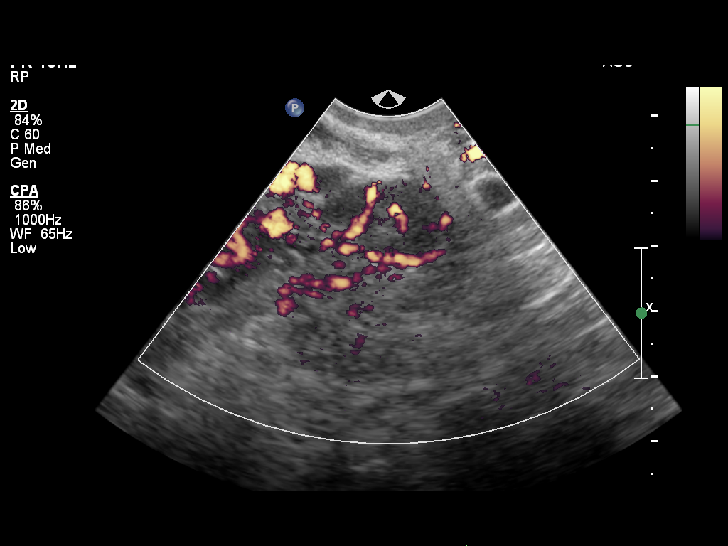
[im 18/30]
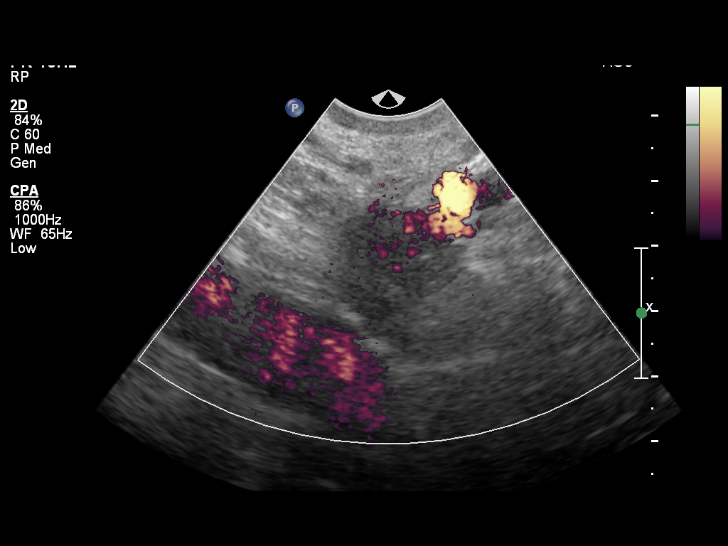
[im 20/30]
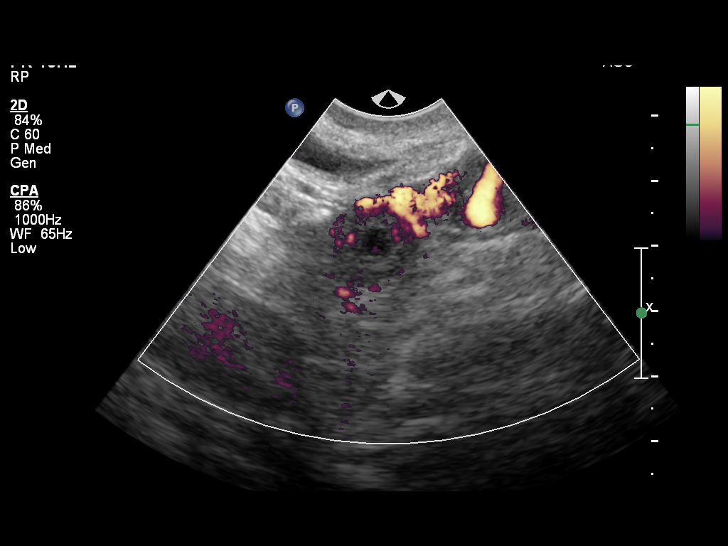
[im 22/30]
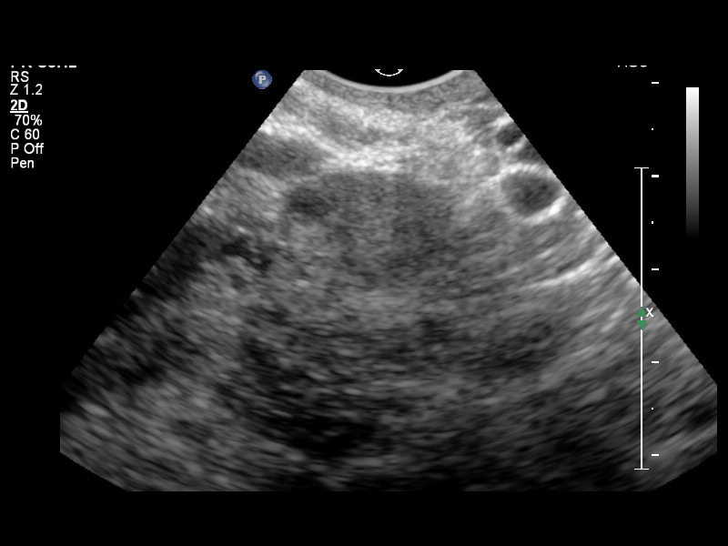
[im 24/30]
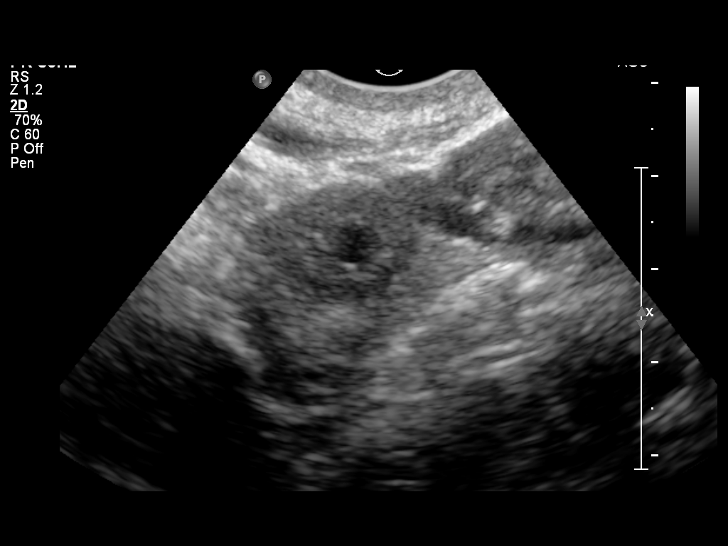
[im 26/30]
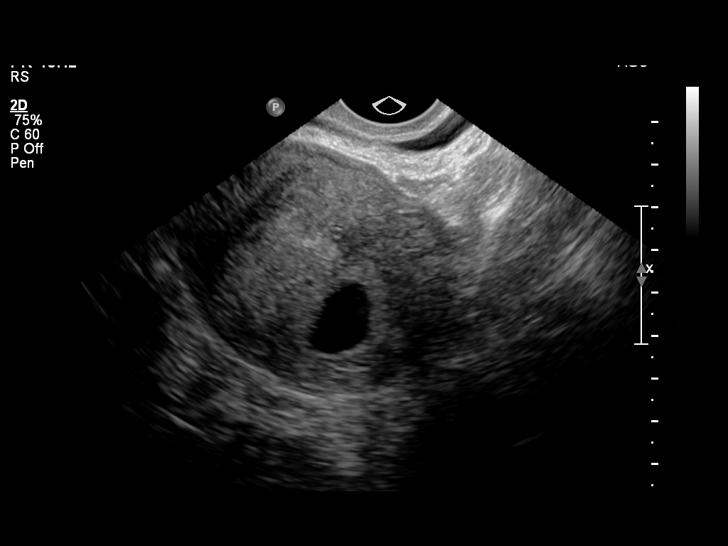
[im 28/30]
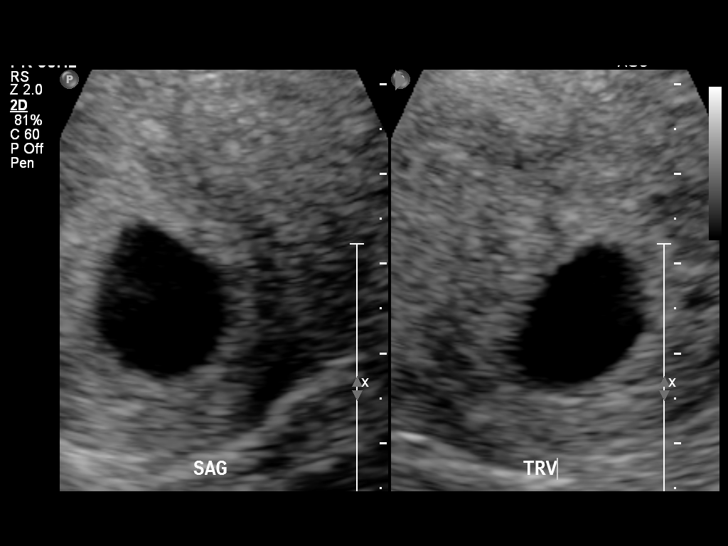

[13 of 28 positions shown; findings below may reference images not displayed]

OBSTETRICS REPORT
                      (Signed Final 08/15/2012 [DATE])

Service(s) Provided

 US OB TRANSVAGINAL                                    76817.0
Indications

 Vaginal bleeding, unknown etiology

 Vaginal bleeding, unknown etiology
 Missed abortion < 22 weeks                            632
 Poor obstetric history: Previous IUFD (stillbirth)
 Poor obstetric history: Previous preterm delivery
 Previous c-section
Fetal Evaluation

 Num Of Fetuses:    0
 Gest. Sac:         Intrauterine
 Yolk Sac:          Not visualized
 Fetal Pole:        Not visualized
 Cardiac Activity:  No embryo visualized
Biometry

 GS:      16.8  mm    G. Age:   6w 5d       < 5  %     EDD:   04/05/13
Gestational Age

 Best:          8w 3d     Det. By:   U/S G S   (08/03/12)     EDD:   03/24/13
Cervix Uterus Adnexa

 Cervix:       Closed.
 Uterus:       No abnormality visualized.
 Cul De Sac:   No free fluid seen.

 Left Ovary:   Not visualized.
 Right Ovary:  Within normal limits. Small corpus luteum noted.
 Adnexa:     No abnormality visualized.
Impression

 Persistent unchanged intrauterine gestational sac with MSD
 of 16.8 mm and no developement of a yolk sac during 12
 days of follow up is suspicious for an anembryonic gestation.
 For definative diaganosis of failed gestation >14 days of
 follow up is needed after visualization of a gestational sac
 without a yolk sac and rescanning should be performed in 3
 days or greater for confirmation.
 Normal ovaries.

 questions or concerns.

## 2014-05-07 ENCOUNTER — Encounter: Payer: Self-pay | Admitting: *Deleted

## 2014-05-07 ENCOUNTER — Ambulatory Visit (HOSPITAL_COMMUNITY): Payer: BC Managed Care – PPO | Attending: Cardiology | Admitting: Cardiology

## 2014-05-07 ENCOUNTER — Encounter (INDEPENDENT_AMBULATORY_CARE_PROVIDER_SITE_OTHER): Payer: BC Managed Care – PPO

## 2014-05-07 DIAGNOSIS — Z1589 Genetic susceptibility to other disease: Secondary | ICD-10-CM

## 2014-05-07 DIAGNOSIS — E7212 Methylenetetrahydrofolate reductase deficiency: Secondary | ICD-10-CM

## 2014-05-07 DIAGNOSIS — I493 Ventricular premature depolarization: Secondary | ICD-10-CM

## 2014-05-07 DIAGNOSIS — R002 Palpitations: Secondary | ICD-10-CM

## 2014-05-07 NOTE — Progress Notes (Signed)
Patient ID: Lisa SellsJamie Weaver, female   DOB: [birth year], 33 y.o.   MRN: [id number] E-cardio 48 hour holter monitor applied to patient.

## 2014-05-07 NOTE — Progress Notes (Signed)
Echo performed. 

## 2014-05-15 ENCOUNTER — Telehealth: Payer: Self-pay | Admitting: Cardiology

## 2014-05-15 NOTE — Telephone Encounter (Signed)
Pt was also made aware of her normal echo results per Dr Delton SeeNelson.  Pt verbalized understanding and agrees with this plan.

## 2014-05-15 NOTE — Telephone Encounter (Signed)
New message ° ° ° °Pt is calling about monitor results °

## 2014-05-15 NOTE — Telephone Encounter (Signed)
Contacted pt about holter monitor results are still being processed and reviewed.  Informed pt that DOD has the monitor results and when he reviews and interprets, I will call back the pt to further advise.  Informed pt that If the monitor was critical, that would have been immediately addressed.  Informed pt that I will call her with the DOD interpretation and with Dr Delton SeeNelson interpretation when she returns to the office.  Pt verbalized understanding and agrees with this plan.

## 2014-05-17 ENCOUNTER — Telehealth: Payer: Self-pay | Admitting: *Deleted

## 2014-05-17 NOTE — Telephone Encounter (Signed)
Notified pt of her 24 hr holter monitor results per Dr Excell Seltzerooper.  Per Dr Excell Seltzerooper the results showed sinus rhythm with occasional PVCs.  Informed pt that Dr Delton SeeNelson has not reviewed this, as she will when she returns to the office.  Informed pt that when she reviews this, we will f/u with her thereafter.

## 2014-05-30 NOTE — Telephone Encounter (Signed)
lmom.She has only occassional PVCs on Holter monitor and normal echo, she should follow in 1 year.pt had previously be given results. recall put in

## 2014-05-30 NOTE — Telephone Encounter (Signed)
She has only occassional PVCs on Holter monitor and normal echo, she should follow in 1 year. Thank you, KN

## 2014-09-03 ENCOUNTER — Encounter: Payer: Self-pay | Admitting: Cardiology

## 2016-06-05 ENCOUNTER — Ambulatory Visit (HOSPITAL_COMMUNITY): Payer: Self-pay

## 2016-06-09 ENCOUNTER — Ambulatory Visit (HOSPITAL_COMMUNITY)
Admission: RE | Admit: 2016-06-09 | Discharge: 2016-06-09 | Disposition: A | Discharge status: Home / Self Care | Payer: 59 | Source: Ambulatory Visit | Attending: Obstetrics and Gynecology | Admitting: Obstetrics and Gynecology

## 2016-06-09 ENCOUNTER — Encounter (HOSPITAL_COMMUNITY): Payer: Self-pay | Admitting: *Deleted

## 2016-06-09 DIAGNOSIS — Z3A01 Less than 8 weeks gestation of pregnancy: Secondary | ICD-10-CM | POA: Insufficient documentation

## 2016-06-09 DIAGNOSIS — E7212 Methylenetetrahydrofolate reductase deficiency: Secondary | ICD-10-CM | POA: Diagnosis not present

## 2016-06-09 DIAGNOSIS — O99281 Endocrine, nutritional and metabolic diseases complicating pregnancy, first trimester: Secondary | ICD-10-CM | POA: Insufficient documentation

## 2016-06-09 DIAGNOSIS — O09291 Supervision of pregnancy with other poor reproductive or obstetric history, first trimester: Secondary | ICD-10-CM | POA: Insufficient documentation

## 2016-06-09 NOTE — Addendum Note (Signed)
Encounter addended by: Cammy CopaJennifer Malcome Ambrocio, RN on: 06/09/2016  1:44 PM<BR>    Actions taken: Charge Capture section accepted

## 2016-06-09 NOTE — Progress Notes (Signed)
MATERNAL FETAL MEDICINE CONSULT  Patient Name: Lisa Weaver Medical Record Number:  [id number] Date of Birth: 01/08/1981 Requesting Physician Name:  Osborn CohoAngela Roberts, MD Date of Service: 06/09/2016  Chief Complaint MTHFR mutation  History of Present Illness Lisa SellsJamie Oberholzer was seen today secondary to MTHFR at the request of Osborn CohoAngela Roberts, MD.  The patient is a 35 y.o. [id number],at 6719w4d with an EDD of 01/29/2017, by Last Menstrual Period dating method.  Ms. [name] history is remarkable for an IUFD at 32 weeks.  She subsequently had a vaginal delivery and underwent testing to attempt to determine the cause of the IUFD.  The only abnormality that was found was a maternal MTHFR mutation.  She then took enoxaparin in her next pregnancy and delivered at 36 weeks via LTCS for breech.  She had just undergone and amniocentesis to determine fetal lung maturity prior to a planned delivery at 37 weeks, which triggered preterm labor.  She has not history of venous thromboembolism.  She has no acute complaints or concerns today.  Review of Systems Pertinent items are noted in HPI.  Patient History OB History  Gravida Para Term Preterm AB Living  4 2 0 2 1 1   SAB TAB Ectopic Multiple Live Births  1 0 0 0 1    # Outcome Date GA Lbr Len/2nd Weight Sex Delivery Anes PTL Lv  4 Current           3 Preterm 07/09/07 1979w0d   M CS-LTranv Spinal Y LIV     Birth Comments: breech; lovenox injections  2 Preterm 2006 2558w0d    Vag-Spont   FD  1 SAB              Birth Comments: System Generated. Please review and update pregnancy details.      Past Medical History:  Diagnosis Date  . Asthma   . Blood dyscrasia    MTHFR- genetic clotting issue  . Infection    urinary tract infection  . MTHFR mutation (HCC)   . Spinal headache     Past Surgical History:  Procedure Laterality Date  . CESAREAN SECTION    . CHOLECYSTECTOMY    . TONSILLECTOMY AND ADENOIDECTOMY      Social History   Social History  . Marital  status: Single    Spouse name: N/A  . Number of children: N/A  . Years of education: N/A   Social History Main Topics  . Smoking status: Never Smoker  . Smokeless tobacco: Never Used  . Alcohol use No     Comment: socially  . Drug use: No  . Sexual activity: Yes    Birth control/ protection: None   Other Topics Concern  . None   Social History Narrative  . None    Family History  Problem Relation Age of Onset  . Hypertrophic cardiomyopathy Brother   . Heart disease Brother     Hypertrophic cadiomyopathy  . Cancer Mother [phone]    Ovarian  . Other Neg Hx    In addition, the patient has no family history of mental retardation, birth defects, or genetic diseases.  Physical Examination Vitals:   06/09/16 1311  BP: 113/74  Pulse: 79   General appearance - alert, well appearing, and in no distress Mental status - alert, oriented to person, place, and time  Assessment and Recommendations 1.  Prior IUFD at 32 weeks.  The most recent data on MTHFR does not demonstrate a clear link between the mutation and thrombophilia or  adverse pregnancy outcome.  Thus, it is unlikely that this was the cause of her IUFD.  Current guidelines do not call for the use of enoxaparin in a patient with an MTHFR mutation in pregnancy.  However, daily prophylactic enoxaparin injections have a very low risk of adverse events, so if Ms. Katona or her referring provider feel strongly because it was used in her last successful pregnancy, it is reasonable to use enoxaparin in this pregnancy.  Ms. Lizer should continue on a baby aspirin given her history of stillbirth.  Unfortunately, I do not have the results of the thrombophilia workup Ms. Towe had at the time of the IUFD in 2006.  If these records are available it would be helpful to confirm if she was in fact tested for anti-phospholipid syndrome, and if not test her for it.  If she tests positive daily prophylactic enoxaparin would be indicated.  Based on her  history of IUFD I recommend serial growth ultrasound approximately every 4 weeks after her anatomy scan and antenatal fetal testing starting at 32 weeks (the gestational age of the IUFD).  I spent 30 minutes with Ms. Derrick today of which 50% was face-to-face counseling.  Thank you for referring Ms. Barlowe to the [facility].  Please do not hesitate to contact us with questions.   Rema Fendt, MD

## 2016-06-09 NOTE — Addendum Note (Signed)
Encounter addended by: Rema FendtJoshua Shirel Mallis, MD on: 06/09/2016  2:14 PM<BR>    Actions taken: Sign clinical note, Charge Capture section accepted

## 2016-07-03 LAB — OB RESULTS CONSOLE RUBELLA ANTIBODY, IGM: Rubella: IMMUNE

## 2016-07-03 LAB — OB RESULTS CONSOLE ANTIBODY SCREEN: Antibody Screen: NEGATIVE

## 2016-07-03 LAB — OB RESULTS CONSOLE RPR: RPR: NONREACTIVE

## 2016-07-03 LAB — OB RESULTS CONSOLE GC/CHLAMYDIA
CHLAMYDIA, DNA PROBE: NEGATIVE
Gonorrhea: NEGATIVE

## 2016-07-03 LAB — OB RESULTS CONSOLE ABO/RH: RH Type: POSITIVE

## 2016-07-03 LAB — OB RESULTS CONSOLE HEPATITIS B SURFACE ANTIGEN: Hepatitis B Surface Ag: NEGATIVE

## 2016-07-03 LAB — OB RESULTS CONSOLE HIV ANTIBODY (ROUTINE TESTING): HIV: NONREACTIVE

## 2016-11-02 HISTORY — PX: BILATERAL SALPINGECTOMY: SHX5743

## 2016-11-25 ENCOUNTER — Other Ambulatory Visit: Payer: Self-pay | Admitting: Obstetrics and Gynecology

## 2016-12-10 ENCOUNTER — Encounter (HOSPITAL_COMMUNITY): Payer: Self-pay

## 2016-12-10 ENCOUNTER — Ambulatory Visit (HOSPITAL_COMMUNITY)
Admission: RE | Admit: 2016-12-10 | Discharge: 2016-12-10 | Disposition: A | Discharge status: Home / Self Care | Payer: 59 | Source: Ambulatory Visit | Attending: Obstetrics and Gynecology | Admitting: Obstetrics and Gynecology

## 2016-12-10 DIAGNOSIS — O34219 Maternal care for unspecified type scar from previous cesarean delivery: Secondary | ICD-10-CM | POA: Insufficient documentation

## 2016-12-10 DIAGNOSIS — O99513 Diseases of the respiratory system complicating pregnancy, third trimester: Secondary | ICD-10-CM | POA: Diagnosis not present

## 2016-12-10 DIAGNOSIS — Z3A39 39 weeks gestation of pregnancy: Secondary | ICD-10-CM | POA: Insufficient documentation

## 2016-12-10 DIAGNOSIS — J45909 Unspecified asthma, uncomplicated: Secondary | ICD-10-CM | POA: Diagnosis not present

## 2016-12-10 DIAGNOSIS — Z9889 Other specified postprocedural states: Secondary | ICD-10-CM | POA: Insufficient documentation

## 2016-12-10 DIAGNOSIS — O09293 Supervision of pregnancy with other poor reproductive or obstetric history, third trimester: Secondary | ICD-10-CM | POA: Insufficient documentation

## 2016-12-10 DIAGNOSIS — Z9049 Acquired absence of other specified parts of digestive tract: Secondary | ICD-10-CM | POA: Diagnosis not present

## 2016-12-10 DIAGNOSIS — Z7982 Long term (current) use of aspirin: Secondary | ICD-10-CM | POA: Insufficient documentation

## 2016-12-10 DIAGNOSIS — Z79899 Other long term (current) drug therapy: Secondary | ICD-10-CM | POA: Diagnosis not present

## 2016-12-10 DIAGNOSIS — Z888 Allergy status to other drugs, medicaments and biological substances status: Secondary | ICD-10-CM | POA: Diagnosis not present

## 2016-12-10 DIAGNOSIS — Z8249 Family history of ischemic heart disease and other diseases of the circulatory system: Secondary | ICD-10-CM | POA: Insufficient documentation

## 2016-12-10 DIAGNOSIS — Z8041 Family history of malignant neoplasm of ovary: Secondary | ICD-10-CM | POA: Diagnosis not present

## 2016-12-10 NOTE — Consult Note (Signed)
Maternal Fetal Medicine Consultation  Requesting Provider(s): Osborn CohoAngela Roberts, MD  Reason for consultation: Hx of MTFHR mutation, prior 32 week IUFD - recommendations for delivery  HPI: Lisa SellsJamie Weaver is a 36 yo G4P0101, EDD 30 Mar who is currently at 32w 6d seen for consultation and recommendations for timing of delivery.  Ms. Lisa JenkinsGudat's past OB history is as follows:  G1 - 2006 - 32 week IUFD.  Did not undergo autopsy.  Reports that "blood clotted going to the fetus".  Underwent thrombophilia work up that showed MTFHR mutation.  Work up was otherwise unremarkable.  G2- treated with Lovenox.  Underwent amniocentesis at 36 weeks and went into labor - underwent cesarean delivery.  G3 - 8 week SAB  See note from Dr. Otho PerlNitsche.  The patient has been treated with baby aspirin and supplemental folic acid.  She is not on Lovenox prophylaxis.  Per the patient report, she underwent an APS work up.  Her anticardiolipin IgM screen was weekly positive; repeat was normal.  She has an additional anticardiolipin antibody screen that is pending.  She is being followed with 2x weekly antenatal testing and serial ultrasounds that to date have been within normal limits.  Ms. Lisa FateGudat also reports that her brother died at an early age due to hypertrophic cardiomyopathy.  She had a normal fetal echo this pregnancy.  Ms. Lisa FateGudat was initially planning on a trial of labor - the practice recently was able to obtain her operative note that showed a single layer closure and she has elected to undergo a scheduled C-section at 39 weeks.  She was concerned that she had a "vertical scar" on her uterus.  At the time of the consultation, the operative note was not available.  Since then, I have been able to review the operative note that reports "a transverse lower uterine incision".  Ms. Lisa FateGudat is without complaints today.  OB History: OB History    Gravida Para Term Preterm AB Living   4 2 0 2 1 1    SAB TAB Ectopic Multiple Live Births    1 0 0 0 1      PMH:  Past Medical History:  Diagnosis Date  . Asthma   . Blood dyscrasia    MTHFR- genetic clotting issue  . Infection    urinary tract infection  . MTHFR mutation (HCC)   . Spinal headache     PSH:  Past Surgical History:  Procedure Laterality Date  . CESAREAN SECTION    . CHOLECYSTECTOMY    . TONSILLECTOMY AND ADENOIDECTOMY     Meds:  Current Outpatient Prescriptions on File Prior to Encounter  Medication Sig Dispense Refill  . albuterol (PROVENTIL HFA;VENTOLIN HFA) 108 (90 BASE) MCG/ACT inhaler Inhale 2 puffs into the lungs every 6 (six) hours as needed. For asthma    . aspirin 81 MG chewable tablet Chew by mouth daily.    Marland Kitchen. FOLIC ACID PO Take by mouth.    . Prenatal Vit-Fe Fumarate-FA (PRENATAL MULTIVITAMIN) TABS tablet Take 1 tablet by mouth daily at 12 noon.    Marland Kitchen. ibuprofen (ADVIL,MOTRIN) 200 MG tablet Take 600 mg by mouth every 6 (six) hours as needed. Takes for pain     No current facility-administered medications on file prior to encounter.    Allergies:  Allergies  Allergen Reactions  . Cefprozil Hives  . Sulfa Drugs Cross Reactors Other (See Comments)    Childhood reaction   FH:  Family History  Problem Relation Age of Onset  .  Hypertrophic cardiomyopathy Brother   . Heart disease Brother     Hypertrophic cadiomyopathy  . Cancer Mother 72    Ovarian  . Other Neg Hx    Soc:  Social History   Social History  . Marital status: Single    Spouse name: N/A  . Number of children: N/A  . Years of education: N/A   Occupational History  . Not on file.   Social History Main Topics  . Smoking status: Never Smoker  . Smokeless tobacco: Never Used  . Alcohol use No     Comment: socially  . Drug use: No  . Sexual activity: Yes    Birth control/ protection: None   Other Topics Concern  . Not on file   Social History Narrative  . No narrative on file    Review of Systems: no vaginal bleeding or cramping/contractions, no LOF,  no nausea/vomiting. All other systems reviewed and are negative.  PE:  254.2 lbs, 117/74, 117   A/P: 1) Single IUP at 32w 6d  2) History of previous 32 week fetal demise - concur with baby aspirin.  Based on the patient's report, she does not meet the criteria for antiphospholipid antibody syndrome- although repeat testing is currently pending.  Concur with continued 2x weekly antenatal testing and serial ultrasounds for growth.  The ACOG guidelines for late preterm deliveries recommends that patients with a history of unexplained IUFD undergo delivery at 39 weeks and that earlier delivery is not recommended.  If fetal testing is reassuring and fetal growth is otherwise appropriate, would concur with this guideline.  Would recommend delivery at 39 weeks unless the patient develops a complication that would require earlier delivery.   3) History of previous C-section - there appears to be some confusion with the patient regarding her previous C-section.  While she did undergo a "single layer closure" an she might not be the best candidate for a trial of labor, this should not be an indication for earlier delivery.  The operative note clearly reports a lower transverse uterine incision.  I was able to contact Lisa Weaver and discuss this with her once we receive a copy of the operative report.   Thank you for the opportunity to be a part of the care of Lisa Weaver. Please contact our office if we can be of further assistance.   I spent approximately 30 minutes with this patient with over 50% of time spent in face-to-face counseling.  Alpha Gula, MD Maternal Fetal Medicine

## 2017-01-10 ENCOUNTER — Inpatient Hospital Stay (HOSPITAL_COMMUNITY)
Admission: AD | Admit: 2017-01-10 | Discharge: 2017-01-10 | Disposition: A | Discharge status: Home / Self Care | Payer: 59 | Source: Ambulatory Visit | Attending: Obstetrics and Gynecology | Admitting: Obstetrics and Gynecology

## 2017-01-10 ENCOUNTER — Encounter (HOSPITAL_COMMUNITY): Payer: Self-pay | Admitting: *Deleted

## 2017-01-10 DIAGNOSIS — Z3A38 38 weeks gestation of pregnancy: Secondary | ICD-10-CM | POA: Insufficient documentation

## 2017-01-10 DIAGNOSIS — O471 False labor at or after 37 completed weeks of gestation: Secondary | ICD-10-CM

## 2017-01-10 LAB — URINALYSIS, ROUTINE W REFLEX MICROSCOPIC
BILIRUBIN URINE: NEGATIVE
Glucose, UA: NEGATIVE mg/dL
HGB URINE DIPSTICK: NEGATIVE
KETONES UR: NEGATIVE mg/dL
NITRITE: NEGATIVE
PROTEIN: NEGATIVE mg/dL
RBC / HPF: NONE SEEN RBC/hpf (ref 0–5)
Specific Gravity, Urine: 1.009 (ref 1.005–1.030)
pH: 6 (ref 5.0–8.0)

## 2017-01-10 NOTE — MAU Note (Signed)
Having extreme pressure, is aware of stomach getting hard, but does not feel contractions.  When she stands or walks it feel like the baby is just going to come out.

## 2017-01-10 NOTE — Progress Notes (Signed)
Bernerd PhoNancy Prothero, CNM, states pt called her for labor eval.  Pt denies contractions, with hx of IUFD, pt wanted to make sure baby is okay.  Urine sent will await results.

## 2017-01-10 NOTE — Progress Notes (Signed)
I have communicated with Lisa Weaver, CNM and reviewed vital signs:  Vitals:   01/10/17 1422  BP: 116/84  Pulse: 105  Resp: 18  Temp: 98.3 F (36.8 C)    Vaginal exam:   ,   Also reviewed contraction pattern and that non-stress test is reactive.  It has been documented that patient is  Having irregular contracting with no cervical change since office visit on 01/08/17 not indicating active labor.  Patient denies any other complaints.  Based on this report provider has given order for discharge.  A discharge order and diagnosis entered by a provider.   Labor discharge instructions reviewed with patient.

## 2017-01-10 NOTE — Progress Notes (Signed)
I have communicated with Bernerd Pho, CNM and reviewed vital signs:    Vaginal exam:   no exam completed ,   Also reviewed contraction pattern and that non-stress test is reactive.  It has been documented that patient is having Uterine irritability not indicating active labor.  Patient denies any other complaints.  Based on this report provider has given order for discharge.  A discharge order and diagnosis entered by a provider.   Labor discharge instructions reviewed with patient.

## 2017-01-10 NOTE — Progress Notes (Signed)
Error in documneting note at 02:54 pm

## 2017-01-15 ENCOUNTER — Encounter (HOSPITAL_COMMUNITY): Payer: Self-pay

## 2017-01-18 ENCOUNTER — Encounter (HOSPITAL_COMMUNITY): Payer: Self-pay

## 2017-01-20 ENCOUNTER — Other Ambulatory Visit: Payer: Self-pay | Admitting: Obstetrics and Gynecology

## 2017-01-21 ENCOUNTER — Encounter (HOSPITAL_COMMUNITY)
Admission: RE | Admit: 2017-01-21 | Discharge: 2017-01-21 | Disposition: A | Discharge status: Home / Self Care | Payer: 59 | Source: Ambulatory Visit | Attending: Obstetrics and Gynecology | Admitting: Obstetrics and Gynecology

## 2017-01-21 ENCOUNTER — Other Ambulatory Visit: Payer: Self-pay | Admitting: Obstetrics and Gynecology

## 2017-01-21 ENCOUNTER — Other Ambulatory Visit: Payer: Self-pay | Admitting: Obstetrics & Gynecology

## 2017-01-21 DIAGNOSIS — O34211 Maternal care for low transverse scar from previous cesarean delivery: Secondary | ICD-10-CM

## 2017-01-21 LAB — CBC
HCT: 35.1 % — ABNORMAL LOW (ref 36.0–46.0)
Hemoglobin: 12 g/dL (ref 12.0–15.0)
MCH: 29.1 pg (ref 26.0–34.0)
MCHC: 34.2 g/dL (ref 30.0–36.0)
MCV: 85.2 fL (ref 78.0–100.0)
PLATELETS: 233 10*3/uL (ref 150–400)
RBC: 4.12 MIL/uL (ref 3.87–5.11)
RDW: 14.5 % (ref 11.5–15.5)
WBC: 8.7 10*3/uL (ref 4.0–10.5)

## 2017-01-21 LAB — TYPE AND SCREEN
ABO/RH(D): A POS
Antibody Screen: NEGATIVE

## 2017-01-21 LAB — ABO/RH: ABO/RH(D): A POS

## 2017-01-21 NOTE — Patient Instructions (Signed)
20 Lisa Weaver  01/21/2017   Your procedure is scheduled on:  01/22/2017  [facility].  Pick up the phone at the desk and dial [phone].   Call this number if you have problems the morning of surgery: [phone]   Remember:   Do not eat food:After Midnight.  Do not drink clear liquids: After Midnight.  Take these medicines the morning of surgery with A SIP OF WATER: Please bring your inhaler.   Do not wear jewelry, make-up or nail polish.  Do not wear lotions, powders, or perfumes. Do not wear deodorant.  Do not shave 48 hours prior to surgery.  Do not bring valuables to the hospital.  [facility] is not   responsible for any belongings or valuables brought to the hospital.  Contacts, dentures or bridgework may not be worn into surgery.  Leave suitcase in the car. After surgery it may be brought to your room.  For patients admitted to the hospital, checkout time is 11:00 AM the day of              discharge.   Patients discharged the day of surgery will not be allowed to drive             home.  Name and phone number of your driver. na  Special Instructions:   N/A   Please read over the following fact sheets that you were given:   Surgical Site Infection Prevention

## 2017-01-22 ENCOUNTER — Inpatient Hospital Stay (HOSPITAL_COMMUNITY): Payer: 59 | Admitting: Anesthesiology

## 2017-01-22 ENCOUNTER — Inpatient Hospital Stay (HOSPITAL_COMMUNITY)
Admission: RE | Admit: 2017-01-22 | Discharge: 2017-01-24 | DRG: 765 | Disposition: A | Discharge status: Home / Self Care | Payer: 59 | Source: Ambulatory Visit | Attending: Obstetrics and Gynecology | Admitting: Obstetrics and Gynecology

## 2017-01-22 ENCOUNTER — Encounter (HOSPITAL_COMMUNITY)
Admission: RE | Disposition: A | Discharge status: Home / Self Care | Payer: Self-pay | Source: Ambulatory Visit | Attending: Obstetrics and Gynecology

## 2017-01-22 ENCOUNTER — Encounter (HOSPITAL_COMMUNITY): Payer: Self-pay | Admitting: Student

## 2017-01-22 DIAGNOSIS — Z3A39 39 weeks gestation of pregnancy: Secondary | ICD-10-CM

## 2017-01-22 DIAGNOSIS — O99214 Obesity complicating childbirth: Secondary | ICD-10-CM | POA: Diagnosis present

## 2017-01-22 DIAGNOSIS — O99824 Streptococcus B carrier state complicating childbirth: Secondary | ICD-10-CM | POA: Diagnosis present

## 2017-01-22 DIAGNOSIS — Z302 Encounter for sterilization: Secondary | ICD-10-CM

## 2017-01-22 DIAGNOSIS — O34211 Maternal care for low transverse scar from previous cesarean delivery: Principal | ICD-10-CM | POA: Diagnosis present

## 2017-01-22 DIAGNOSIS — Z6841 Body Mass Index (BMI) 40.0 and over, adult: Secondary | ICD-10-CM | POA: Diagnosis not present

## 2017-01-22 LAB — CBC
HEMATOCRIT: 33 % — AB (ref 36.0–46.0)
HEMOGLOBIN: 11.3 g/dL — AB (ref 12.0–15.0)
MCH: 28.8 pg (ref 26.0–34.0)
MCHC: 34.2 g/dL (ref 30.0–36.0)
MCV: 84 fL (ref 78.0–100.0)
Platelets: 222 10*3/uL (ref 150–400)
RBC: 3.93 MIL/uL (ref 3.87–5.11)
RDW: 14.2 % (ref 11.5–15.5)
WBC: 14.8 10*3/uL — ABNORMAL HIGH (ref 4.0–10.5)

## 2017-01-22 LAB — CREATININE, SERUM
Creatinine, Ser: 0.74 mg/dL (ref 0.44–1.00)
GFR calc Af Amer: >60 mL/min (ref >60)
GFR calc non Af Amer: >60 mL/min (ref >60)

## 2017-01-22 LAB — RPR: RPR Ser Ql: NONREACTIVE

## 2017-01-22 SURGERY — Surgical Case
Anesthesia: Spinal

## 2017-01-22 MED ORDER — ONDANSETRON HCL 4 MG/2ML IJ SOLN: 4.0000 mg | Freq: Three times a day (TID) | INTRAMUSCULAR | Status: DC | PRN

## 2017-01-22 MED ORDER — OXYCODONE-ACETAMINOPHEN 5-325 MG PO TABS
2.0000 | ORAL_TABLET | ORAL | Status: DC | PRN
  Administered 2017-01-23 – 2017-01-24 (×5): 2 via ORAL
  Filled 2017-01-22 (×5): qty 2

## 2017-01-22 MED ORDER — SIMETHICONE 80 MG PO CHEW
80.0000 mg | CHEWABLE_TABLET | ORAL | Status: DC | PRN
Start: 2017-01-22 — End: 2017-01-24

## 2017-01-22 MED ORDER — METOCLOPRAMIDE HCL 5 MG/ML IJ SOLN
INTRAMUSCULAR | Status: AC
  Filled 2017-01-22: qty 2

## 2017-01-22 MED ORDER — KETOROLAC TROMETHAMINE 30 MG/ML IJ SOLN
30.0000 mg | Freq: Once | INTRAMUSCULAR | Status: AC | PRN
  Administered 2017-01-22: 30 mg via INTRAVENOUS

## 2017-01-22 MED ORDER — OXYCODONE HCL 5 MG PO TABS
5.0000 mg | ORAL_TABLET | Freq: Once | ORAL | Status: DC | PRN
Start: 2017-01-22 — End: 2017-01-22

## 2017-01-22 MED ORDER — MEPERIDINE HCL 25 MG/ML IJ SOLN
INTRAMUSCULAR | Status: AC
  Filled 2017-01-22: qty 1

## 2017-01-22 MED ORDER — SCOPOLAMINE 1 MG/3DAYS TD PT72
MEDICATED_PATCH | TRANSDERMAL | Status: AC
  Filled 2017-01-22: qty 1

## 2017-01-22 MED ORDER — NALBUPHINE HCL 10 MG/ML IJ SOLN: 5.0000 mg | INTRAMUSCULAR | Status: DC | PRN

## 2017-01-22 MED ORDER — DIPHENHYDRAMINE HCL 50 MG/ML IJ SOLN: 12.5000 mg | INTRAMUSCULAR | Status: DC | PRN

## 2017-01-22 MED ORDER — METHYLERGONOVINE MALEATE 0.2 MG PO TABS: 0.2000 mg | ORAL_TABLET | ORAL | Status: DC | PRN

## 2017-01-22 MED ORDER — SIMETHICONE 80 MG PO CHEW
80.0000 mg | CHEWABLE_TABLET | Freq: Three times a day (TID) | ORAL | Status: DC
Start: 2017-01-22 — End: 2017-01-24
  Filled 2017-01-22 (×2): qty 1

## 2017-01-22 MED ORDER — BUPIVACAINE HCL (PF) 0.25 % IJ SOLN
INTRAMUSCULAR | Status: DC | PRN
  Administered 2017-01-22: 20 mL

## 2017-01-22 MED ORDER — COCONUT OIL OIL: 1.0000 "application " | TOPICAL_OIL | Status: DC | PRN

## 2017-01-22 MED ORDER — LACTATED RINGERS IV SOLN: INTRAVENOUS | Status: DC

## 2017-01-22 MED ORDER — SIMETHICONE 80 MG PO CHEW
80.0000 mg | CHEWABLE_TABLET | ORAL | Status: DC
  Filled 2017-01-22 (×2): qty 1

## 2017-01-22 MED ORDER — SOD CITRATE-CITRIC ACID 500-334 MG/5ML PO SOLN: 30.0000 mL | Freq: Once | ORAL | Status: DC

## 2017-01-22 MED ORDER — TETANUS-DIPHTH-ACELL PERTUSSIS 5-2.5-18.5 LF-MCG/0.5 IM SUSP: 0.5000 mL | Freq: Once | INTRAMUSCULAR | Status: DC

## 2017-01-22 MED ORDER — NALBUPHINE HCL 10 MG/ML IJ SOLN: 5.0000 mg | Freq: Once | INTRAMUSCULAR | Status: DC | PRN

## 2017-01-22 MED ORDER — MORPHINE SULFATE (PF) 0.5 MG/ML IJ SOLN
INTRAMUSCULAR | Status: AC
  Filled 2017-01-22: qty 10

## 2017-01-22 MED ORDER — KETOROLAC TROMETHAMINE 30 MG/ML IJ SOLN: 30.0000 mg | Freq: Four times a day (QID) | INTRAMUSCULAR | Status: AC | PRN

## 2017-01-22 MED ORDER — SODIUM CHLORIDE 0.9% FLUSH: 3.0000 mL | INTRAVENOUS | Status: DC | PRN

## 2017-01-22 MED ORDER — PRENATAL MULTIVITAMIN CH
1.0000 | ORAL_TABLET | Freq: Every day | ORAL | Status: DC
  Administered 2017-01-23 – 2017-01-24 (×2): 1 via ORAL
  Filled 2017-01-22 (×2): qty 1

## 2017-01-22 MED ORDER — MENTHOL 3 MG MT LOZG: 1.0000 | LOZENGE | OROMUCOSAL | Status: DC | PRN

## 2017-01-22 MED ORDER — OXYTOCIN 10 UNIT/ML IJ SOLN
INTRAVENOUS | Status: DC | PRN
  Administered 2017-01-22: 40 [IU] via INTRAVENOUS

## 2017-01-22 MED ORDER — BUPIVACAINE HCL (PF) 0.25 % IJ SOLN
INTRAMUSCULAR | Status: AC
Start: 2017-01-22 — End: 2017-01-22
  Filled 2017-01-22: qty 30

## 2017-01-22 MED ORDER — DEXAMETHASONE SODIUM PHOSPHATE 4 MG/ML IJ SOLN
INTRAMUSCULAR | Status: AC
  Filled 2017-01-22: qty 1

## 2017-01-22 MED ORDER — METHYLERGONOVINE MALEATE 0.2 MG/ML IJ SOLN: 0.2000 mg | INTRAMUSCULAR | Status: DC | PRN

## 2017-01-22 MED ORDER — MEPERIDINE HCL 25 MG/ML IJ SOLN
INTRAMUSCULAR | Status: DC | PRN
  Administered 2017-01-22: 12.5 mg via INTRAVENOUS

## 2017-01-22 MED ORDER — DIPHENHYDRAMINE HCL 25 MG PO CAPS: 25.0000 mg | ORAL_CAPSULE | ORAL | Status: DC | PRN

## 2017-01-22 MED ORDER — SENNOSIDES-DOCUSATE SODIUM 8.6-50 MG PO TABS
2.0000 | ORAL_TABLET | ORAL | Status: DC
  Administered 2017-01-23: 2 via ORAL
  Filled 2017-01-22 (×2): qty 2

## 2017-01-22 MED ORDER — IBUPROFEN 600 MG PO TABS
600.0000 mg | ORAL_TABLET | Freq: Four times a day (QID) | ORAL | Status: DC
  Administered 2017-01-22 – 2017-01-24 (×9): 600 mg via ORAL
  Filled 2017-01-22 (×10): qty 1

## 2017-01-22 MED ORDER — ZOLPIDEM TARTRATE 5 MG PO TABS: 5.0000 mg | ORAL_TABLET | Freq: Every evening | ORAL | Status: DC | PRN

## 2017-01-22 MED ORDER — SODIUM CHLORIDE 0.9 % IJ SOLN
INTRAMUSCULAR | Status: AC
  Filled 2017-01-22: qty 20

## 2017-01-22 MED ORDER — METOCLOPRAMIDE HCL 5 MG/ML IJ SOLN
10.0000 mg | Freq: Once | INTRAMUSCULAR | Status: AC | PRN
  Administered 2017-01-22: 10 mg via INTRAVENOUS

## 2017-01-22 MED ORDER — ONDANSETRON HCL 4 MG/2ML IJ SOLN
INTRAMUSCULAR | Status: DC | PRN
  Administered 2017-01-22: 4 mg via INTRAVENOUS

## 2017-01-22 MED ORDER — SCOPOLAMINE 1 MG/3DAYS TD PT72
1.0000 | MEDICATED_PATCH | Freq: Once | TRANSDERMAL | Status: AC
  Administered 2017-01-22: 1 via TRANSDERMAL

## 2017-01-22 MED ORDER — ONDANSETRON HCL 4 MG/2ML IJ SOLN
INTRAMUSCULAR | Status: AC
  Filled 2017-01-22: qty 2

## 2017-01-22 MED ORDER — FERROUS SULFATE 325 (65 FE) MG PO TABS
325.0000 mg | ORAL_TABLET | Freq: Two times a day (BID) | ORAL | Status: DC
Start: 2017-01-22 — End: 2017-01-24
  Administered 2017-01-22 – 2017-01-24 (×4): 325 mg via ORAL
  Filled 2017-01-22 (×4): qty 1

## 2017-01-22 MED ORDER — PHENYLEPHRINE 8 MG IN D5W 100 ML (0.08MG/ML) PREMIX OPTIME
INJECTION | INTRAVENOUS | Status: DC | PRN
  Administered 2017-01-22: 60 ug/min via INTRAVENOUS

## 2017-01-22 MED ORDER — HYDROMORPHONE HCL 1 MG/ML IJ SOLN: 0.2500 mg | INTRAMUSCULAR | Status: DC | PRN

## 2017-01-22 MED ORDER — OXYCODONE-ACETAMINOPHEN 5-325 MG PO TABS
1.0000 | ORAL_TABLET | ORAL | Status: DC | PRN
  Administered 2017-01-23: 1 via ORAL
  Filled 2017-01-22: qty 1

## 2017-01-22 MED ORDER — FENTANYL CITRATE (PF) 100 MCG/2ML IJ SOLN
INTRAMUSCULAR | Status: DC | PRN
  Administered 2017-01-22: 10 ug via INTRATHECAL

## 2017-01-22 MED ORDER — SODIUM CHLORIDE 0.9 % IR SOLN
Status: DC | PRN
  Administered 2017-01-22: 3000 mL

## 2017-01-22 MED ORDER — NALOXONE HCL 0.4 MG/ML IJ SOLN: 0.4000 mg | INTRAMUSCULAR | Status: DC | PRN

## 2017-01-22 MED ORDER — KETOROLAC TROMETHAMINE 30 MG/ML IJ SOLN
INTRAMUSCULAR | Status: AC
  Filled 2017-01-22: qty 1

## 2017-01-22 MED ORDER — BUPIVACAINE IN DEXTROSE 0.75-8.25 % IT SOLN
INTRATHECAL | Status: DC | PRN
  Administered 2017-01-22: 1.6 mL via INTRATHECAL

## 2017-01-22 MED ORDER — MEASLES, MUMPS & RUBELLA VAC ~~LOC~~ INJ
0.5000 mL | INJECTION | Freq: Once | SUBCUTANEOUS | Status: DC
  Filled 2017-01-22: qty 0.5

## 2017-01-22 MED ORDER — DEXAMETHASONE SODIUM PHOSPHATE 4 MG/ML IJ SOLN
INTRAMUSCULAR | Status: DC | PRN
  Administered 2017-01-22: 4 mg via INTRAVENOUS

## 2017-01-22 MED ORDER — LACTATED RINGERS IV SOLN
INTRAVENOUS | Status: DC
  Administered 2017-01-22: 10:00:00 via INTRAVENOUS

## 2017-01-22 MED ORDER — OXYTOCIN 10 UNIT/ML IJ SOLN
INTRAMUSCULAR | Status: AC
  Filled 2017-01-22: qty 4

## 2017-01-22 MED ORDER — OXYCODONE HCL 5 MG/5ML PO SOLN: 5.0000 mg | Freq: Once | ORAL | Status: DC | PRN

## 2017-01-22 MED ORDER — MORPHINE SULFATE (PF) 0.5 MG/ML IJ SOLN
INTRAMUSCULAR | Status: DC | PRN
  Administered 2017-01-22: .2 mg via INTRATHECAL

## 2017-01-22 MED ORDER — DIBUCAINE 1 % RE OINT: 1.0000 "application " | TOPICAL_OINTMENT | RECTAL | Status: DC | PRN

## 2017-01-22 MED ORDER — PHENYLEPHRINE 8 MG IN D5W 100 ML (0.08MG/ML) PREMIX OPTIME
INJECTION | INTRAVENOUS | Status: AC
  Filled 2017-01-22: qty 100

## 2017-01-22 MED ORDER — LACTATED RINGERS IV SOLN
INTRAVENOUS | Status: DC
  Administered 2017-01-22 (×3): via INTRAVENOUS

## 2017-01-22 MED ORDER — FENTANYL CITRATE (PF) 100 MCG/2ML IJ SOLN
INTRAMUSCULAR | Status: AC
Start: 2017-01-22 — End: 2017-01-22
  Filled 2017-01-22: qty 2

## 2017-01-22 MED ORDER — NALOXONE HCL 2 MG/2ML IJ SOSY
1.0000 ug/kg/h | PREFILLED_SYRINGE | INTRAVENOUS | Status: DC | PRN
  Filled 2017-01-22: qty 2

## 2017-01-22 MED ORDER — ACETAMINOPHEN 325 MG PO TABS
650.0000 mg | ORAL_TABLET | ORAL | Status: DC | PRN
  Administered 2017-01-23 (×2): 650 mg via ORAL
  Filled 2017-01-22 (×2): qty 2

## 2017-01-22 MED ORDER — OXYTOCIN 40 UNITS IN LACTATED RINGERS INFUSION - SIMPLE MED: 2.5000 [IU]/h | INTRAVENOUS | Status: AC

## 2017-01-22 MED ORDER — DIPHENHYDRAMINE HCL 25 MG PO CAPS: 25.0000 mg | ORAL_CAPSULE | Freq: Four times a day (QID) | ORAL | Status: DC | PRN

## 2017-01-22 MED ORDER — GENTAMICIN SULFATE 40 MG/ML IJ SOLN
INTRAVENOUS | Status: AC
  Administered 2017-01-22: 116 mL via INTRAVENOUS
  Filled 2017-01-22: qty 10.25

## 2017-01-22 MED ORDER — WITCH HAZEL-GLYCERIN EX PADS: 1.0000 "application " | MEDICATED_PAD | CUTANEOUS | Status: DC | PRN

## 2017-01-22 MED ORDER — SCOPOLAMINE 1 MG/3DAYS TD PT72
1.0000 | MEDICATED_PATCH | Freq: Once | TRANSDERMAL | Status: DC
  Filled 2017-01-22: qty 1

## 2017-01-22 MED ORDER — ENOXAPARIN SODIUM 40 MG/0.4ML ~~LOC~~ SOLN
40.0000 mg | SUBCUTANEOUS | Status: DC
  Administered 2017-01-23 – 2017-01-24 (×2): 40 mg via SUBCUTANEOUS
  Filled 2017-01-22 (×3): qty 0.4

## 2017-01-22 SURGICAL SUPPLY — 45 items
BARRIER ADHS 3X4 INTERCEED (GAUZE/BANDAGES/DRESSINGS) ×3 IMPLANT
BENZOIN TINCTURE PRP APPL 2/3 (GAUZE/BANDAGES/DRESSINGS) ×3 IMPLANT
BOOTIES KNEE HIGH SLOAN (MISCELLANEOUS) ×6 IMPLANT
CHLORAPREP W/TINT 26ML (MISCELLANEOUS) ×3 IMPLANT
CLAMP CORD UMBIL (MISCELLANEOUS) IMPLANT
CLOSURE WOUND 1/2 X4 (GAUZE/BANDAGES/DRESSINGS) ×1
CLOTH BEACON ORANGE TIMEOUT ST (SAFETY) ×3 IMPLANT
DRAIN CHANNEL 10M FLAT 3/4 FLT (DRAIN) ×3 IMPLANT
DRAIN JACKSON PRT FLT 10 (DRAIN) IMPLANT
DRSG OPSITE POSTOP 4X10 (GAUZE/BANDAGES/DRESSINGS) ×3 IMPLANT
ELECT REM PT RETURN 9FT ADLT (ELECTROSURGICAL) ×3
ELECTRODE REM PT RTRN 9FT ADLT (ELECTROSURGICAL) ×1 IMPLANT
EVACUATOR SILICONE 100CC (DRAIN) ×3 IMPLANT
EXTRACTOR VACUUM M CUP 4 TUBE (SUCTIONS) IMPLANT
EXTRACTOR VACUUM M CUP 4' TUBE (SUCTIONS)
GAUZE SPONGE 4X4 12PLY STRL LF (GAUZE/BANDAGES/DRESSINGS) ×6 IMPLANT
GLOVE BIOGEL PI IND STRL 7.0 (GLOVE) ×2 IMPLANT
GLOVE BIOGEL PI INDICATOR 7.0 (GLOVE) ×4
GLOVE ECLIPSE 6.5 STRL STRAW (GLOVE) ×3 IMPLANT
GOWN STRL REUS W/TWL LRG LVL3 (GOWN DISPOSABLE) ×6 IMPLANT
KIT ABG SYR 3ML LUER SLIP (SYRINGE) IMPLANT
NEEDLE HYPO 22GX1.5 SAFETY (NEEDLE) ×3 IMPLANT
NEEDLE HYPO 25X5/8 SAFETYGLIDE (NEEDLE) IMPLANT
NS IRRIG 1000ML POUR BTL (IV SOLUTION) ×9 IMPLANT
PACK C SECTION WH (CUSTOM PROCEDURE TRAY) ×3 IMPLANT
PAD ABD 7.5X8 STRL (GAUZE/BANDAGES/DRESSINGS) ×3 IMPLANT
PAD OB MATERNITY 4.3X12.25 (PERSONAL CARE ITEMS) ×3 IMPLANT
PENCIL SMOKE EVAC W/HOLSTER (ELECTROSURGICAL) ×3 IMPLANT
RTRCTR C-SECT PINK 25CM LRG (MISCELLANEOUS) ×3 IMPLANT
SPONGE GAUZE 4X4 12PLY STER LF (GAUZE/BANDAGES/DRESSINGS) ×9 IMPLANT
SPONGE LAP 18X18 X RAY DECT (DISPOSABLE) ×12 IMPLANT
STRIP CLOSURE SKIN 1/2X4 (GAUZE/BANDAGES/DRESSINGS) ×2 IMPLANT
SUT MNCRL AB 3-0 PS2 27 (SUTURE) ×3 IMPLANT
SUT PLAIN 2 0 XLH (SUTURE) ×3 IMPLANT
SUT SILK 2 0 FSL 18 (SUTURE) ×3 IMPLANT
SUT VIC AB 0 CTX 36 (SUTURE) ×4
SUT VIC AB 0 CTX36XBRD ANBCTRL (SUTURE) ×2 IMPLANT
SUT VIC AB 1 CT1 36 (SUTURE) ×6 IMPLANT
SUT VIC AB 2-0 CT1 27 (SUTURE)
SUT VIC AB 2-0 CT1 TAPERPNT 27 (SUTURE) IMPLANT
SUT VIC AB 3-0 SH 27 (SUTURE) ×2
SUT VIC AB 3-0 SH 27X BRD (SUTURE) ×1 IMPLANT
SYR 20CC LL (SYRINGE) ×3 IMPLANT
TOWEL OR 17X24 6PK STRL BLUE (TOWEL DISPOSABLE) ×3 IMPLANT
TRAY FOLEY CATH SILVER 14FR (SET/KITS/TRAYS/PACK) ×3 IMPLANT

## 2017-01-22 NOTE — Transfer of Care (Signed)
Immediate Anesthesia Transfer of Care Note  Patient: Lisa Weaver  Procedure(s) Performed: Procedure(s): CESAREAN SECTION (N/A)  Patient Location: PACU  Anesthesia Type:Spinal  Level of Consciousness: awake, alert , oriented and patient cooperative  Airway & Oxygen Therapy: Patient Spontanous Breathing  Post-op Assessment: Report given to RN and Post -op Vital signs reviewed and stable  Post vital signs: Reviewed and stable  Last Vitals:  Vitals:   01/22/17 0814 01/22/17 0815  BP:  134/87  Pulse:  95  Resp: 18 18  Temp: 36.9 C 36.9 C    Last Pain:  Vitals:   01/22/17 0815  TempSrc: Oral         Complications: No apparent anesthesia complications

## 2017-01-22 NOTE — H&P (Signed)
Lisa Weaver is a 36 y.o. female, [id number] at 37 weeks, presenting for elective repeat low transverse cesarean section and bilateral tubal ligation.  PT has hx of stillbirth at 32 weeks.Pt has history of previous LTCS at 36 week after amnio for lung maturity and breech presentation. Pt was worked up for phospholipid antibody syndrome and was negative. Pt did have a low postivie for cardiolipin ab IGM, IGG negative. Per MFM no Lovenox indicated  Plan was to be put on 81mg  ASA and folic acid.  Pt has a family hx of hypertrophic cardiomyopathy with a normal fetal echo.  Was treated for UTI during this pregnancy with toc negative.  Patient Active Problem List   Diagnosis Date Noted  . Encounter for maternal care for low transverse scar from repeat cesarean delivery 01/22/2017  . Delivered by cesarean section 01/22/2017  . False labor after 37 completed weeks of gestation 01/10/2017  . 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 Palpitations 05/03/2014  . Family history of hypertrophic cardiomyopathy 05/03/2014  . Probable Missed abortion 08/15/2012  . History of stillbirth 07/27/2012  . History of preterm delivery, currently pregnant 07/27/2012  . History of cesarean delivery 07/27/2012  . 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 Migraine 12/10/2011    History of present pregnancy: Patient entered care at 10 weeks.   EDC of 01/29/17 was established by LMP.   Anatomy scan:  20 weeks, with normal findings and an posterior  Placenta with marginal cord insertion Additional [place] evaluations:  Every 4 week growth and antenatal testing from 32 weeks on.   Significant prenatal events:  GBS positive   Last evaluation:  Last week  OB History    Gravida Para Term Preterm AB Living   4 2 0 2 1 1    SAB TAB Ectopic Multiple Live Births   1 0 0 0 1     Past Medical History:  Diagnosis Date  . Asthma   . Blood dyscrasia    MTHFR- genetic clotting issue  . Infection    urinary tract infection  . MTHFR mutation (HCC)   .  Spinal headache    Past Surgical History:  Procedure Laterality Date  . CESAREAN SECTION    . CHOLECYSTECTOMY    . TONSILLECTOMY AND ADENOIDECTOMY    . WISDOM TOOTH EXTRACTION     Family History: family history includes Cancer (age of onset: 44) in her mother; Heart disease in her brother; Hypertrophic cardiomyopathy in her brother. Social History:  reports that she has never smoked. She has never used smokeless tobacco. She reports that she does not drink alcohol or use drugs.   Prenatal Transfer Tool  Maternal Diabetes: No Genetic Screening: Normal Maternal Ultrasounds/Referrals: Normal Fetal Ultrasounds or other Referrals:  Fetal echo and MFM consult Maternal Substance Abuse:  No Significant Maternal Medications:  None Significant Maternal Lab Results: None  TDAP Yes Flu Yes  ROS:  All 10 systems reviewed and negative except as stated above  Allergies  Allergen Reactions  . Cefprozil Hives  . Sulfa Antibiotics Hives       Height 5\' 6"  (1.676 m), weight 117.9 kg (260 lb), last menstrual period 04/24/2016, unknown if currently breastfeeding.  Chest clear Heart RRR without murmur Abd gravid, NT, FH appropriate  Pelvic: Defereed Ext: =1/=1 edema  FHR: 162 UCs:  None  Prenatal labs: ABO, Rh: --/--/A POS, A POS (03/22 1040) Antibody: NEG (03/22 1040) Rubella:  Immuy RPR: Non Reactive (03/22 1040)  HBsAg: Negative (09/01 0000)  HIV: Non-reactive (  09/01 0000)  GBS:  Immune Sickle cell/Hgb electrophoresis:  N/A Pap:  negative GC:  Negative Chlamydia:  Negative Genetic screenings:  Negative Glucola:  132 Other:   Hgb 11.4 at NOB, 9.7 at 28 weeks       Assessment/Plan: IUP at 39 weeks IUP presents for elective repeat LTCS with sterilization. Cat 1 strip GBS positive  Plan: Admit to Birthing Suite per consult with Rivard Routine [facility] orders   Henderson NewcomerNancy Jean ProtheroCNM, MSN 01/22/2017, 7:33 AM

## 2017-01-22 NOTE — Lactation Note (Signed)
This note was copied from a baby's chart. Lactation Consultation Note  Patient Name: Lisa Gasper SellsJamie Gowdy ZOXWR'UToday's Date: 01/22/2017 Reason for consult: Initial assessment   Initial assessment with mom of 1 hour old infant in PACU. Nursery RN reports infant has not fed as mom not feeling well. Infant swaddled and hold by FOB. Mom reports she tried to BF her 89 yo for 1 week and pumped for 1 month.   Mom reports she does not want to BF nor hand express at this time. Breast exam not completed at this time.   Discussed with mom that BF infants eat 8-12 x in 24 hours at first feeding cues. Enc mom to call out for assistance as needed. Feeding log given with instructions for use.   BF Resources Handout and LC Brochure given, mom informed of IP/OP Services, BF Support Groups and LC phone #. FOB asked when infant should be fed, enc feeding when infant showing feeding cues.   Maternal Data Formula Feeding for Exclusion: Yes Reason for exclusion: Mother's choice to formula and breast feed on admission Has patient been taught Hand Expression?: No Does the patient have breastfeeding experience prior to this delivery?: Yes  Feeding Feeding Type:  (Mom declined)  LATCH Score/Interventions                      Lactation Tools Discussed/Used WIC Program: No   Consult Status Consult Status: Follow-up Date: 01/23/17 Follow-up type: In-patient    Silas FloodSharon S Rosielee Corporan 01/22/2017, 11:52 AM

## 2017-01-22 NOTE — Op Note (Signed)
Preoperative diagnosis: Intrauterine pregnancy at 39 weeks, previous cesarean section  Post operative diagnosis: Same  Anesthesia: Spinal  Anesthesiologist: Dr. Hyacinth MeekerMiller  Procedure: Repeat low transverse cesarean section with bilateral salpyngectomy  Surgeon: Dr. Dois DavenportSandra Rayana Geurin  Assistant: Bernerd PhoNancy Prothero CNM  Estimated blood loss: 800 cc  Procedure:  After being informed of the planned procedure and possible complications including bleeding, infection, injury to other organs, informed consent is obtained. The patient is taken to OR #9 and given spinal anesthesia without complication. She is placed in the dorsal decubitus position with the pelvis tilted to the left. She is then prepped and draped in a sterile fashion. A Foley catheter is inserted in her bladder.  After assessing adequate level of anesthesia, we infiltrate the suprapubic area with 20 cc of Marcaine 0.25 and perform a Pfannenstiel incision which is brought down sharply to the fascia. The fascia is entered in a low transverse fashion. Linea alba is dissected. Peritoneum is entered in a midline fashion. An Alexis retractor is easily positioned.   The myometrium is then entered in a low transverse fashion, 2 cm above the vesico-uterine junction ; first with knife and then extended bluntly. Amniotic fluid is light meconium. We assist the birth of a female  infant in vertex presentation. Mouth and nose are suctioned. The baby is delivered. The cord is clamped and sectioned. The baby is given to the neonatologist present in the room.  10 cc of blood is drawn from the umbilical vein.The placenta is allowed to deliver spontaneously. It is complete and the cord has 3 vessels. Uterine revision is negative.  We proceed with closure of the myometrium in 2 layers after dissecting the bladder further down sharply: First with a running locked suture of 0 Vicryl, then with a Lembert suture of 0 Vicryl imbricating the first one. Hemostasis is  completed with cauterization on peritoneal edges and 2 figure-of-eight stitches of 3-0 Vicryl on the bladder serosa.  Both paracolic gutters are cleaned. Both tubes and ovaries are assessed and normal.   We then proceed with bilateral salpyngectomy for sterilization.  Each tube is grasped with a Babcock forceps. Using a Rogers clamp, we isolate the tube by clamping the mesosalpynx. The tube is sharply excised and the mesosalpynx is sutured with 2 transfixed sutures of 2-0 vicryl. Hemostasis is adequate  The pelvis is profusely irrigated with warm saline to confirm a satisfactory hemostasis.A sheet of Interceed is place on the hysterotomy.  Retractors and sponges are removed. Under fascia hemostasis is completed with cauterization. The fascia is then closed with 2 running sutures of 0 Vicryl meeting midline. The wound is irrigated with warm saline and hemostasis is completed with cauterization. The skin is closed with a subcuticular suture of 3-0 Monocryl and Steri-Strips.  Instrument and sponge count is complete x2. Estimated blood loss is 800 cc.  The procedure is well tolerated by the patient who is taken to recovery room in a well and stable condition.  female baby named Karna Christmasdrianna was born at 10:13 and received an Apgar of 8  at 1 minute and 9 at 5 minutes.    Specimen: Placenta sent to L & D, both tubes to pathology   [facility] County HospitalRIVARD,Sholanda Croson A MD 3/23/20189:19 AM

## 2017-01-22 NOTE — Anesthesia Preprocedure Evaluation (Signed)
Anesthesia Evaluation  Patient identified by MRN, date of birth, ID band Patient awake    Reviewed: Allergy & Precautions, NPO status , Patient's Chart, lab work & pertinent test results  History of Anesthesia Complications (+) POST - OP SPINAL HEADACHE and history of anesthetic complications  Airway Mallampati: II  TM Distance: >3 FB Neck ROM: Full    Dental no notable dental hx.    Pulmonary neg pulmonary ROS, asthma ,    Pulmonary exam normal breath sounds clear to auscultation       Cardiovascular negative cardio ROS Normal cardiovascular exam Rhythm:Regular Rate:Normal     Neuro/Psych  Headaches, negative neurological ROS  negative psych ROS   GI/Hepatic negative GI ROS, Neg liver ROS,   Endo/Other  negative endocrine ROSMorbid obesity  Renal/GU negative Renal ROS  negative genitourinary   Musculoskeletal negative musculoskeletal ROS (+)   Abdominal   Peds negative pediatric ROS (+)  Hematology negative hematology ROS (+)   Anesthesia Other Findings   Reproductive/Obstetrics negative OB ROS (+) Pregnancy                             Anesthesia Physical Anesthesia Plan  ASA: III  Anesthesia Plan: Spinal   Post-op Pain Management:    Induction:   Airway Management Planned: Natural Airway  Additional Equipment:   Intra-op Plan:   Post-operative Plan: Extubation in OR  Informed Consent: I have reviewed the patients History and Physical, chart, labs and discussed the procedure including the risks, benefits and alternatives for the proposed anesthesia with the patient or authorized representative who has indicated his/her understanding and acceptance.   Dental advisory given  Plan Discussed with: CRNA  Anesthesia Plan Comments:         Anesthesia Quick Evaluation

## 2017-01-22 NOTE — Anesthesia Postprocedure Evaluation (Signed)
Anesthesia Post Note  Patient: Lisa Weaver  Procedure(s) Performed: Procedure(s) (LRB): CESAREAN SECTION (N/A)  Patient location during evaluation: Mother Baby Anesthesia Type: Spinal Level of consciousness: awake, awake and alert and oriented Pain management: pain level controlled Vital Signs Assessment: post-procedure vital signs reviewed and stable Respiratory status: spontaneous breathing, nonlabored ventilation and respiratory function stable Cardiovascular status: stable Postop Assessment: no headache, no backache, no signs of nausea or vomiting, adequate PO intake and patient able to bend at knees Anesthetic complications: no        Last Vitals:  Vitals:   01/22/17 1530 01/22/17 1730  BP: 119/72 120/73  Pulse: 63 65  Resp: 16 16  Temp: 37.1 C 37 C    Last Pain:  Vitals:   01/22/17 1730  TempSrc: Oral  PainSc:    Pain Goal:                 Marlinda Miranda

## 2017-01-22 NOTE — Anesthesia Procedure Notes (Cosign Needed)
Spinal  Patient location during procedure: OR Start time: 01/22/2017 9:30 AM End time: 01/22/2017 9:32 AM Staffing Anesthesiologist: Anitra LauthMILLER, WARREN RAY Performed: anesthesiologist  Preanesthetic Checklist Completed: patient identified, site marked, surgical consent, pre-op evaluation, timeout performed, IV checked, risks and benefits discussed and monitors and equipment checked Spinal Block Patient position: sitting Prep: ChloraPrep Patient monitoring: heart rate, cardiac monitor, continuous pulse ox and blood pressure Approach: midline Location: L3-4 Injection technique: single-shot Needle Needle type: Whitacre  Needle gauge: 25 G Needle length: 9 cm Assessment Sensory level: T4

## 2017-01-22 NOTE — Anesthesia Postprocedure Evaluation (Signed)
Anesthesia Post Note  Patient: Lisa SellsJamie Betzold  Procedure(s) Performed: Procedure(s) (LRB): CESAREAN SECTION (N/A)  Patient location during evaluation: PACU Anesthesia Type: Spinal Level of consciousness: oriented and awake and alert Pain management: pain level controlled Vital Signs Assessment: post-procedure vital signs reviewed and stable Respiratory status: spontaneous breathing and respiratory function stable Cardiovascular status: blood pressure returned to baseline and stable Postop Assessment: no headache and no backache Anesthetic complications: no        Last Vitals:  Vitals:   01/22/17 1215 01/22/17 1230  BP: 130/87 116/76  Pulse: 72 77  Resp: 12 18  Temp:  36.6 C    Last Pain:  Vitals:   01/22/17 1230  TempSrc: Oral  PainSc: 0-No pain   Pain Goal:                 [name]

## 2017-01-22 NOTE — Interval H&P Note (Signed)
History and Physical Interval Note:  01/22/2017 9:18 AM  [name]  has presented today for surgery, with the diagnosis of Prior Cesarean Section  The various methods of treatment have been discussed with the patient and family. After consideration of risks, benefits and other options for treatment, the patient has consented to  Procedure(s): CESAREAN SECTION (N/A) with bilateral salpyngectomy for sterilization as a surgical intervention .  The patient's history has been reviewed, patient examined, no change in status, stable for surgery.  I have reviewed the patient's chart and labs.  Questions were answered to the patient's satisfaction.     Damien Cisar A

## 2017-01-23 LAB — CBC
HEMATOCRIT: 31 % — AB (ref 36.0–46.0)
HEMOGLOBIN: 10.6 g/dL — AB (ref 12.0–15.0)
MCH: 29 pg (ref 26.0–34.0)
MCHC: 34.2 g/dL (ref 30.0–36.0)
MCV: 84.9 fL (ref 78.0–100.0)
Platelets: 192 10*3/uL (ref 150–400)
RBC: 3.65 MIL/uL — AB (ref 3.87–5.11)
RDW: 14.4 % (ref 11.5–15.5)
WBC: 11.1 10*3/uL — AB (ref 4.0–10.5)

## 2017-01-23 NOTE — Progress Notes (Signed)
Subjective: Postpartum Day 1: Cesarean Delivery repeat with BTL Patient reports tolerating PO, + flatus and no problems voiding.  Ambulating without difficulty.  Bottlefeeding  Objective: Vital signs in last 24 hours: Temp:  [97.6 F (36.4 C)-98.9 F (37.2 C)] 98.7 F (37.1 C) (03/24 0500) Pulse Rate:  [62-83] 74 (03/24 0500) Resp:  [12-20] 20 (03/24 0500) BP: (103-130)/(57-90) 109/65 (03/24 0500) SpO2:  [94 %-100 %] 98 % (03/24 0500)  Physical Exam:  General: alert, cooperative and no distress Lochia: appropriate Uterine Fundus: firm Incision: covered and dry DVT Evaluation: No evidence of DVT seen on physical exam. Negative Homan's sign.   Recent Labs  01/22/17 1411 01/23/17 0537  HGB 11.3* 10.6*  HCT 33.0* 31.0*    Assessment/Plan: Status post Cesarean section. Doing well postoperatively.  Continue current care.  Lisa Weaver 01/23/2017, 8:49 AM

## 2017-01-23 NOTE — Lactation Note (Signed)
This note was copied from a baby's chart. Lactation Consultation Note  Patient Name: Girl Gasper SellsJamie States WUJWJ'XToday's Date: 01/23/2017   Visited with Mom, baby 7927 hrs old.  GMOB feeding baby a bottle of formula, and Mom eating her lunch.  Asked Mom if there was any questions or need for assistance from lactation.  Baby has been exclusively formula fed since birth.  Mom declines offer for latch assist.  Mom stated she wanted to pump.  Explained benefits of breast massage and hand expression.  Mom declined offer for instruction on hand expression.  Explained how colostrum is thick and often times too thick to pass through the pump parts to the bottle.  Suggested she use her finger to let baby suck any EBM from pump parts.   Set up DEBP with instructions.  Mom needing guidance on how to hold the flanges.  To call for assistance as needed with pumping.  RN aware that Mom has started pumping.   Encouraged STS as much as possible.   Judee ClaraSmith, Chistina Roston E 01/23/2017, 2:00 PM

## 2017-01-24 MED ORDER — OXYCODONE-ACETAMINOPHEN 5-325 MG PO TABS: 1.0000 | ORAL_TABLET | ORAL | 0 refills | Status: DC | PRN

## 2017-01-24 NOTE — Lactation Note (Signed)
This note was copied from a baby's chart. Lactation Consultation Note; Mom states she is not going to pump or breast feed baby. No questions at present.   Patient Name: Lisa Weaver [id number] Date: 01/24/2017     Maternal Data    Feeding    LATCH Score/Interventions                      Lactation Tools Discussed/Used     Consult Status; Complete      Pamelia Hoit 01/24/2017, 12:48 PM

## 2017-01-24 NOTE — Clinical Social Work Maternal (Signed)
  CLINICAL SOCIAL WORK MATERNAL/CHILD NOTE  Patient Details  Name: Lisa Weaver MRN: [id number] Date of Birth: 25-Mar-1981  Date:  01/24/2017  Clinical Social Worker Initiating Note:  Ferdinand Lango Malayasia Mirkin, MSW, LCSW-A  Date/ Time Initiated:  01/24/17/0942     Child's Name:  Lisa Weaver   Legal Guardian:  Other (Comment) (Not established by court system; MOB and FOB Lisa Weaver) are parenting collectively )   Need for Interpreter:  None   Date of Referral:  01/22/17     Reason for Referral:  Other (Comment) (MOB not holding baby and/or feeding baby )   Referral Source:  [facility]   Address:  [place] [address]  Phone number:  [phone]   Household Members:  Self, Significant Other   Natural Supports (not living in the home):  Immediate Family, Extended Family, Friends   Professional Supports: None   Employment: Other (comment)   Type of Work: Unknown    Education:  9 to 11 years   Pensions consultant:  Multimedia programmer   Other Resources:  Other (Comment) (None reported)   Cultural/Religious Considerations Which May Impact Care:  None reported.   Strengths:  Ability to meet basic needs , Compliance with medical plan , Home prepared for child , Pediatrician chosen  ([facility] )   Risk Factors/Current Problems:  None   Cognitive State:  Goal Oriented , Alert , Insightful , Able to Concentrate    Mood/Affect:  Calm , Comfortable , Interested , Happy    CSW Assessment: CSW met with MOB at bedside to complete assessment for consult regarding MOB not bonding with baby by holding and feeding. MOB was warm and welcoming of this writers arrival. CSW explained role and reasoning for visit. MOB noted she has held and fed baby and she is unsure why they think she is not. MOB further noted she is bottle feeding baby but she leaves baby in basinet when she is tired. This Probation officer encouraged MOB to bond with baby and perform skin to skin  often so that she and baby can get aquatinted. MOB understood. This Probation officer assessed MOB's mood/feelings about baby's arrival, MOB noted she feels good and is happy. She reports "everything is just fine". This Probation officer noted MOB has support from FOB and her mother. This Probation officer discussed PPD and safe sleeping/SIDS. MOB verbalized understanding noting she has prior knowledge of both. MOB notes she has no further needs at this time. At this time MOB seems appropriate for care of baby upon d/c home thus, case closed to this CSW.   CSW Plan/Description:  Information/Referral to [employer] , [employer] , No Further Intervention Required/No Barriers to Discharge    [employer], MSW, [facility]  Office: [phone]

## 2017-01-24 NOTE — Progress Notes (Signed)
Subjective: Postpartum Day 2: Cesarean Delivery repeat with BTL Patient reports incisional pain, + flatus and no problems voiding.  Drain still having return.  Pt taking pain medication and ambulating.  Objective: Vital signs in last 24 hours: Temp:  [98 F (36.7 C)-98.6 F (37 C)] 98 F (36.7 C) (03/25 0502) Pulse Rate:  [82-86] 82 (03/25 0502) Resp:  [18] 18 (03/25 0502) BP: (119-128)/(73-77) 128/77 (03/25 0502) SpO2:  [98 %] 98 % (03/24 1807)  Physical Exam:  General: alert, cooperative and mild distress Lochia: appropriate Uterine Fundus: firm Incision: healing well, no significant drainage DVT Evaluation: No evidence of DVT seen on physical exam. Negative Homan's sign. Drain 10cc fluid  Recent Labs  01/22/17 1411 01/23/17 0537  HGB 11.3* 10.6*  HCT 33.0* 31.0*    Assessment/Plan: Status post Cesarean section. Doing well postoperatively.  Continue current care.  Pt unsure if wants to be discharged today.    Henderson Newcomerancy Jean Cortne Amara 01/24/2017, 9:43 AM

## 2017-01-24 NOTE — Discharge Summary (Signed)
OB Discharge Summary     Patient Name: Lisa SellsJamie Weaver DOB: 1981/09/30 MRN: [id number]  Date of admission: 01/22/2017 Delivering MD: Silverio LayIVARD, SANDRA   Date of discharge: 01/24/2017  Admitting diagnosis: Prior Cesarean Section Intrauterine pregnancy: 823w0d     Secondary diagnosis:  Active Problems:   Delivered by cesarean section   Cesarean delivery delivered  Additional problems: None     Discharge diagnosis: Term Pregnancy Delivered                                                                                                Post partum procedures:postpartum tubal ligation  Augmentation: None  Complications: None  Hospital course:  Sceduled C/S   36 y.o. yo [id number] at 403w0d was admitted to the hospital 01/22/2017 for scheduled cesarean section with the following indication:Elective Repeat.  Membrane Rupture Time/Date: 10:13 AM ,01/22/2017   Patient delivered a Viable infant.01/22/2017  Details of operation can be found in separate operative note.  Pateint had an uncomplicated postpartum course.  She is ambulating, tolerating a regular diet, passing flatus, and urinating well. Patient is discharged home in stable condition on  01/24/17         Physical exam  Vitals:   01/23/17 0500 01/23/17 0905 01/23/17 1807 01/24/17 0502  BP: 109/65 103/63 119/73 128/77  Pulse: 74 74 86 82  Resp: 20 20 18 18   Temp: 98.7 F (37.1 C) 98.2 F (36.8 C) 98.6 F (37 C) 98 F (36.7 C)  TempSrc:  Oral Oral Oral  SpO2: 98% 98% 98%   Weight:      Height:       General: alert, cooperative and no distress Lochia: appropriate Uterine Fundus: firm Incision: Healing with no signs of infection.  New dressing applied. Drain removed without difficulty. DVT Evaluation: No evidence of DVT seen on physical exam. Negative Homan's sign. Labs: Lab Results  Component Value Date   WBC 11.1 (H) 01/23/2017   HGB 10.6 (L) 01/23/2017   HCT 31.0 (L) 01/23/2017   MCV 84.9 01/23/2017   PLT 192 01/23/2017    CMP Latest Ref Rng & Units 01/22/2017  Glucose 70 - 99 mg/dL -  BUN 6 - 23 mg/dL -  Creatinine 1.470.44 - 8.291.00 mg/dL 5.620.74  Sodium 130135 - 865145 mEq/L -  Potassium 3.5 - 5.1 mEq/L -  Chloride 96 - 112 mEq/L -  CO2 19 - 32 mEq/L -  Calcium 8.4 - 10.5 mg/dL -  Total Protein 6.0 - 8.3 g/dL -  Total Bilirubin 0.3 - 1.2 mg/dL -  Alkaline Phos 39 - 784117 U/L -  AST 0 - 37 U/L -  ALT 0 - 35 U/L -    Discharge instruction: per After Visit Summary and "Baby and Me Booklet".  After visit meds:  Allergies as of 01/24/2017      Reactions   Cefprozil Hives   Sulfa Antibiotics Hives      Medication List    STOP taking these medications   aspirin EC 81 MG tablet   ferrous sulfate 325 (65 FE) MG tablet   folic acid 1 MG  tablet Commonly known as:  FOLVITE     TAKE these medications   acetaminophen 500 MG tablet Commonly known as:  TYLENOL Take 1,000 mg by mouth every 6 (six) hours as needed for mild pain or headache.   albuterol 108 (90 Base) MCG/ACT inhaler Commonly known as:  PROVENTIL HFA;VENTOLIN HFA Inhale 2 puffs into the lungs every 6 (six) hours as needed for wheezing or shortness of breath.   [employer] PRENATAL GUMMY PO Take 2 tablets by mouth daily.   oxyCODONE-acetaminophen 5-325 MG tablet Commonly known as:  PERCOCET/ROXICET Take 1 tablet by mouth every 4 (four) hours as needed (pain scale 4-7).       Diet: routine diet  Activity: Advance as tolerated. Pelvic rest for 6 weeks.   Outpatient follow up:6 weeks Follow up Appt:No future appointments. Follow up Visit:No Follow-up on file.  Postpartum contraception: Tubal Ligation  Newborn Data: Live born female  Birth Weight: 7 lb 13.2 oz (3550 g) APGAR: 8, 9  Baby Feeding: Bottle Disposition:home with mother   01/24/2017 [name], CNM

## 2017-11-17 ENCOUNTER — Ambulatory Visit: Payer: 59 | Admitting: Neurology

## 2017-11-17 ENCOUNTER — Encounter: Payer: Self-pay | Admitting: Neurology

## 2017-11-17 VITALS — BP 122/86 | HR 89 | Ht 66.0 in | Wt 223.6 lb

## 2017-11-17 DIAGNOSIS — G43709 Chronic migraine without aura, not intractable, without status migrainosus: Secondary | ICD-10-CM | POA: Diagnosis not present

## 2017-11-17 MED ORDER — ATENOLOL 25 MG PO TABS: 25.0000 mg | ORAL_TABLET | Freq: Every day | ORAL | 6 refills | Status: DC

## 2017-11-17 MED ORDER — RIZATRIPTAN BENZOATE 10 MG PO TABS: 10.0000 mg | ORAL_TABLET | ORAL | 11 refills | Status: DC | PRN

## 2017-11-17 MED ORDER — TIZANIDINE HCL 4 MG PO TABS: 4.0000 mg | ORAL_TABLET | Freq: Four times a day (QID) | ORAL | 11 refills | Status: DC | PRN

## 2017-11-17 MED ORDER — TOPIRAMATE 200 MG PO TABS: 200.0000 mg | ORAL_TABLET | Freq: Every day | ORAL | 4 refills | Status: DC

## 2017-11-17 NOTE — Progress Notes (Addendum)
[facility]    Provider:  Dr Lucia Gaskins Referring Provider: Dr. Blossom Hoops from [facility] Primary Care Physician:  Dr. Blossom Hoops from [facility]  CC:  Chronic migraine  HPI:  Lisa Weaver is a 37 y.o. female here as a referral from Dr. Clarisse Gouge for migraines. She has been on Topamax for years. Started at the age of 32. Migraines are sharp and shooting, starts on one side of the head but can be either. It radiates through the eye to the back of the head. She gets neck pain, eye blurriness. Pounding and throbbing, light and sound sensitivity. Sun glasses help. Light makes it worse. Needs a quiet room. No significant nausea or vomiting maybe rare nausea. She has frequent mild headaches. No medication overuse. 20 headache days a month, she will have 2-3 severe migraine days a month. Can last 24 hours or longer without treatment. 7-8 moderate-mild migraine days, the rest are mild low-level headache. She had tubes removed no risk of pregnancy. She used to be on 300mg  and that worked better. No signs or symptoms of sleep apnea. She has done food diary and everything, no food triggers, she watches her sleep. The night before her period she will get a migraine. She has allergy induced infrequent asthma.  No known history of migraines.   Reviewed notes, labs and imaging from outside physicians, which showed: Reviewed notes.  Patient has been on topiramate for many years except when she was pregnant, baby was born in March 2018.  In pregnancy she would use Tylenol a few times a week no medication overuse.  She stopped taking Topamax during pregnancy.  She did well during pregnancy however a week after delivery she started getting more intense headaches.  Miller to those headache she has had throughout the years.  At her last visit in May 2018 she stated 10 severe headache days, 8 moderate and 5 mild days a month.  The headache throbs with associated light and sound  sensitivity worsened with movement.  She is tried ibuprofen in the past.  In the past she has been on Topamax up to 300 mg a day which was of benefit.  She tried Imitrex and was transiently making her dizzy.  She does have an allergy to sulfa.  Also allergy to Cefzil and Axert.  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  No known history of migraines.  Diagnosed with migraine and episodic tension type headaches.  He started her on Topamax at this last appointment.   Tried: Axert, Imitrex Bmp in the past unremarkable  Review of Systems: Patient complains of symptoms per HPI as well as the following symptoms: blurred vision, eye pain, headache, dizziness. Pertinent negatives and positives per HPI. All others negative.   Social History   Socioeconomic History  . Marital status: Single    Spouse name: Not on file  . Number of children: Not on file  . Years of education: Not on file  . Highest education level: Not on file  Social Needs  . Financial resource strain: Not on file  . Food insecurity - worry: Not on file  . Food insecurity - inability: Not on file  . Transportation needs - medical: Not on file  . Transportation needs - non-medical: Not on file  Occupational History  . Not on file  Tobacco Use  . Smoking status: Never Smoker  . Smokeless tobacco: Never Used  Substance and Sexual Activity  . Alcohol use: No  Comment: socially  . Drug use: No  . Sexual activity: Yes    Birth control/protection: None  Other Topics Concern  . Not on file  Social History Narrative  . Not on file    Family History  Problem Relation Age of Onset  . Hypertrophic cardiomyopathy Brother   . Heart disease Brother        Hypertrophic cadiomyopathy  . Cancer Mother [id number]       Ovarian  . Other Neg Hx     Past Medical History:  Diagnosis Date  . Asthma   . Blood dyscrasia    MTHFR- genetic clotting issue  . Infection    urinary tract infection  .  MTHFR mutation (HCC)   . Spinal headache     Past Surgical History:  Procedure Laterality Date  . CESAREAN SECTION    . CESAREAN SECTION N/A 01/22/2017   Procedure: CESAREAN SECTION;  Surgeon: Silverio LaySandra Rivard, MD;  [facility];  Service: Obstetrics;  Laterality: N/A;  . CHOLECYSTECTOMY    . TONSILLECTOMY AND ADENOIDECTOMY    . WISDOM TOOTH EXTRACTION      Current Outpatient Medications  Medication Sig Dispense Refill  . albuterol (PROVENTIL HFA;VENTOLIN HFA) 108 (90 BASE) MCG/ACT inhaler Inhale 2 puffs into the lungs every 6 (six) hours as needed for wheezing or shortness of breath.     Marland Kitchen. ibuprofen (ADVIL,MOTRIN) 600 MG tablet Take 600 mg by mouth every 6 (six) hours as needed.    . SUMAtriptan (IMITREX) 100 MG tablet Take 100 mg by mouth every 2 (two) hours as needed for migraine. May repeat in 2 hours if headache persists or recurs.    . topiramate (TOPAMAX) 200 MG tablet Take 200 mg by mouth daily.     No current facility-administered medications for this visit.     Allergies as of 11/17/2017 - Review Complete 11/17/2017  Allergen Reaction Noted  . Cefprozil Hives 12/10/2011  . Sulfa antibiotics Hives 01/10/2017    Vitals: BP 122/86   Pulse 89   Ht 5\' 6"  (1.676 m)   Wt 223 lb 9.6 oz (101.4 kg)   BMI 36.09 kg/m  Last Weight:  Wt Readings from Last 1 Encounters:  11/17/17 223 lb 9.6 oz (101.4 kg)   Last Height:   Ht Readings from Last 1 Encounters:  11/17/17 5\' 6"  (1.676 m)    Physical exam: Exam: Gen: NAD, conversant, well nourised, obese, well groomed                     CV: RRR, no MRG. No Carotid Bruits. No peripheral edema, warm, nontender Eyes: Conjunctivae clear without exudates or hemorrhage  Neuro: Detailed Neurologic Exam  Speech:    Speech is normal; fluent and spontaneous with normal comprehension.  Cognition:    The patient is oriented to person, place, and time;     recent and remote memory intact;     language fluent;      normal attention, concentration,     fund of knowledge Cranial Nerves:    The pupils are equal, round, and reactive to light. The fundi are normal and spontaneous venous pulsations are present. Visual fields are full to finger confrontation. Extraocular movements are intact. Trigeminal sensation is intact and the muscles of mastication are normal. The face is symmetric. The palate elevates in the midline. Hearing intact. Voice is normal. Shoulder shrug is normal. The tongue has normal motion without fasciculations.   Coordination:    Normal  finger to nose and heel to shin. Normal rapid alternating movements.   Gait:    Heel-toe and tandem gait are normal.   Motor Observation:    No asymmetry, no atrophy, and no involuntary movements noted. Tone:    Normal muscle tone.    Posture:    Posture is normal. normal erect    Strength:    Strength is V/V in the upper and lower limbs.      Sensation: intact to LT     Reflex Exam:  DTR's:    Deep tendon reflexes in the upper and lower extremities are normal bilaterally.   Toes:    The toes are downgoing bilaterally.   Clonus:    Clonus is absent.      Assessment/Plan:  37 year old with chronic migraines  Acute management: Rizatriptan (Maxalt): Please take one tablet at the onset of your headache. If it does not improve the symptoms please take one additional tablet in 2 hours. Do not take more then 2 tablets in 24hrs. Do not take use more then 2 to 3 times in a week. May also use Tizanidine.   Continue Topiramate  Start Atenolol 25mg  at bedtime  Magnesium citrate 400mg  to 600mg  daily, riboflavin 400mg  daily, Coenzyme Q 10 100mg  three times daily Consider joining Facebook group [facility].  Dicussed: To prevent or relieve headaches, try the following: Cool Compress. Lie down and place a cool compress on your head.  Avoid headache triggers. If certain foods or odors seem to have triggered your migraines in the  past, avoid them. A headache diary might help you identify triggers.  Include physical activity in your daily routine. Try a daily walk or other moderate aerobic exercise.  Manage stress. Find healthy ways to cope with the stressors, such as delegating tasks on your to-do list.  Practice relaxation techniques. Try deep breathing, yoga, massage and visualization.  Eat regularly. Eating regularly scheduled meals and maintaining a healthy diet might help prevent headaches. Also, drink plenty of fluids.  Follow a regular sleep schedule. Sleep deprivation might contribute to headaches Consider biofeedback. With this mind-body technique, you learn to control certain bodily functions - such as muscle tension, heart rate and blood pressure - to prevent headaches or reduce headache pain.    Proceed to emergency room if you experience new or worsening symptoms or symptoms do not resolve, if you have new neurologic symptoms or if headache is severe, or for any concerning symptom.   Provided education and documentation from [employer] headache [employer] toolbox including articles on: chronic migraine medication overuse headache, chronic migraines, prevention of migraines, behavioral and other nonpharmacologic treatments for headache.  Cc: Dr. Blossom Hoops from [facility]  Naomie Dean, MD  [facility] [address] [place], [place] [address]  Phone [phone] Fax [phone]

## 2017-11-17 NOTE — Patient Instructions (Addendum)
Acute management: Rizatriptan (Maxalt): Please take one tablet at the onset of your headache. If it does not improve the symptoms please take one additional tablet in 2 hours. Do not take more then 2 tablets in 24hrs. Do not take use more then 2 to 3 times in a week. May also use Tizanidine.   Continue Topiramate  Start Atenolol 25mg  at bedtime  Magnesium citrate 400mg  to 600mg  daily, riboflavin 400mg  daily, Coenzyme Q 10 100mg  three times daily Consider joining Facebook group [facility].  Atenolol tablets What is this medicine? ATENOLOL (a TEN oh lole) is a beta-blocker. Beta-blockers reduce the workload on the heart and help it to beat more regularly. This medicine is used to treat high blood pressure and to prevent chest pain. It is also used to protect the heart during a heart attack and to prevent an additional heart attack from occurring. Also used in migraine management.   This medicine may be used for other purposes; ask your health care provider or pharmacist if you have questions. COMMON BRAND NAME(S): Tenormin What should I tell my health care provider before I take this medicine? They need to know if you have any of these conditions: -diabetes -heart or vessel disease like slow heart rate, worsening heart failure, heart block, sick sinus syndrome or Raynaud's disease -kidney disease -lung or breathing disease, like asthma or emphysema -pheochromocytoma -thyroid disease -an unusual or allergic reaction to atenolol, other beta-blockers, medicines, foods, dyes, or preservatives -pregnant or trying to get pregnant -breast-feeding How should I use this medicine? Take this medicine by mouth with a drink of water. Follow the directions on the prescription label. This medicine may be taken with or without food. Take your medicine at regular intervals. Do not take more medicine than directed. Do not stop taking this medicine suddenly. This could lead to serious  heart-related effects. Talk to your pediatrician regarding the use of this medicine in children. Special care may be needed. Overdosage: If you think you have taken too much of this medicine contact a poison control center or emergency room at once. NOTE: This medicine is only for you. Do not share this medicine with others. What if I miss a dose? If you miss a dose, take it as soon as you can. If it is almost time for your next dose, take only that dose. Do not take double or extra doses. What may interact with this medicine? This medicine may interact with the following medications: -certain medicines for blood pressure, heart disease, irregular heart beat -clonidine -digoxin -diuretics -dobutamine -epinephrine -isoproterenol -NSAIDs, medicines for pain and inflammation, like ibuprofen or naproxen -reserpine This list may not describe all possible interactions. Give your health care provider a list of all the medicines, herbs, non-prescription drugs, or dietary supplements you use. Also tell them if you smoke, drink alcohol, or use illegal drugs. Some items may interact with your medicine. What should I watch for while using this medicine? Visit your doctor or health care professional for regular check ups. Check your blood pressure and pulse rate regularly. Ask your health care professional what your blood pressure and pulse rate should be, and when you should contact him or her. You may get drowsy or dizzy. Do not drive, use machinery, or do anything that needs mental alertness until you know how this medicine affects you. Do not stand or sit up quickly. Alcohol may interfere with the effect of this medicine. Avoid alcoholic drinks. This medicine can affect blood  sugar levels. If you have diabetes, check with your doctor or health care professional before you change your diet or the dose of your diabetic medicine. Do not treat yourself for coughs, colds, or pain while you are taking this  medicine without asking your doctor or health care professional for advice. Some ingredients may increase your blood pressure. What side effects may I notice from receiving this medicine? Side effects that you should report to your doctor or health care professional as soon as possible: -allergic reactions like skin rash, itching or hives, swelling of the face, lips, or tongue -breathing problems -changes in vision -chest pain -cold, tingling, or numb hands or feet -depression -fast, irregular heartbeat -feeling faint or lightheaded, falls -fever with sore throat -rapid weight gain -swollen ankles, legs Side effects that usually do not require medical attention (report to your doctor or health care professional if they continue or are bothersome): -anxiety, nervous -diarrhea -dry skin -change in sex drive or performance -headache -nightmares or trouble sleeping -short term memory loss -stomach upset -unusually tired This list may not describe all possible side effects. Call your doctor for medical advice about side effects. You may report side effects to FDA at 1-800-FDA-1088. Where should I keep my medicine? Keep out of the reach of children. Store at room temperature between 20 and 25 degrees C (68 and 77 degrees F). Close tightly and protect from light. Throw away any unused medicine after the expiration date. NOTE: This sheet is a summary. It may not cover all possible information. If you have questions about this medicine, talk to your doctor, pharmacist, or health care provider.  2018 Else[employer]/Gold Standard (2013-06-25 14:21:28)  Tizanidine tablets or capsules What is this medicine? TIZANIDINE (tye ZAN i deen) helps to relieve muscle spasms. It may be used to help in the treatment of multiple sclerosis and spinal cord injury. This medicine may be used for other purposes; ask your health care provider or pharmacist if you have questions. COMMON BRAND NAME(S): Zanaflex What  should I tell my health care provider before I take this medicine? They need to know if you have any of these conditions: -kidney disease -liver disease -low blood pressure -mental disorder -an unusual or allergic reaction to tizanidine, other medicines, lactose (tablets only), foods, dyes, or preservatives -pregnant or trying to get pregnant -breast-feeding How should I use this medicine? Take this medicine by mouth with a full glass of water. Take this medicine on an empty stomach, at least 30 minutes before or 2 hours after food. Do not take with food unless you talk with your doctor. Follow the directions on the prescription label. Take your medicine at regular intervals. Do not take your medicine more often than directed. Do not stop taking except on your doctor's advice. Suddenly stopping the medicine can be very dangerous. Talk to your pediatrician regarding the use of this medicine in children. Patients over 105 years old may have a stronger reaction and need a smaller dose. Overdosage: If you think you have taken too much of this medicine contact a poison control center or emergency room at once. NOTE: This medicine is only for you. Do not share this medicine with others. What if I miss a dose? If you miss a dose, take it as soon as you can. If it is almost time for your next dose, take only that dose. Do not take double or extra doses. What may interact with this medicine? Do not take this medicine with any of  the following medications: -ciprofloxacin -cisapride -dofetilide -dronedarone -fluvoxamine -narcotic medicines for cough -pimozide -thiabendazole -thioridazine -ziprasidone This medicine may also interact with the following medications: -acyclovir -alcohol -antihistamines for allergy, cough and cold -baclofen -certain antibiotics like levofloxacin, ofloxacin -certain medicines for anxiety or sleep -certain medicines for blood pressure, heart disease, irregular heart  beat -certain medicines for depression like amitriptyline, fluoxetine, sertraline -certain medicines for seizures like phenobarbital, primidone -certain medicines for stomach problems like cimetidine, famotidine -female hormones, like estrogens or progestins and birth control pills, patches, rings, or injections -general anesthetics like halothane, isoflurane, methoxyflurane, propofol -local anesthetics like lidocaine, pramoxine, tetracaine -medicines that relax muscles for surgery -narcotic medicines for pain -other medicines that prolong the QT interval (cause an abnormal heart rhythm) -phenothiazines like chlorpromazine, mesoridazine, prochlorperazine -ticlopidine -zileuton This list may not describe all possible interactions. Give your health care provider a list of all the medicines, herbs, non-prescription drugs, or dietary supplements you use. Also tell them if you smoke, drink alcohol, or use illegal drugs. Some items may interact with your medicine. What should I watch for while using this medicine? Tell your doctor or health care professional if your symptoms do not start to get better or if they get worse. You may get drowsy or dizzy. Do not drive, use machinery, or do anything that needs mental alertness until you know how this medicine affects you. Do not stand or sit up quickly, especially if you are an older patient. This reduces the risk of dizzy or fainting spells. Alcohol may interfere with the effect of this medicine. Avoid alcoholic drinks. If you are taking another medicine that also causes drowsiness, you may have more side effects. Give your health care provider a list of all medicines you use. Your doctor will tell you how much medicine to take. Do not take more medicine than directed. Call emergency for help if you have problems breathing or unusual sleepiness. Your mouth may get dry. Chewing sugarless gum or sucking hard candy, and drinking plenty of water may help.  Contact your doctor if the problem does not go away or is severe. What side effects may I notice from receiving this medicine? Side effects that you should report to your doctor or health care professional as soon as possible: -allergic reactions like skin rash, itching or hives, swelling of the face, lips, or tongue -breathing problems -hallucinations -signs and symptoms of liver injury like dark yellow or brown urine; general ill feeling or flu-like symptoms; light-colored stools; loss of appetite; nausea; right upper quadrant belly pain; unusually weak or tired; yellowing of the eyes or skin -signs and symptoms of low blood pressure like dizziness; feeling faint or lightheaded, falls; unusually weak or tired -unusually slow heartbeat -unusually weak or tired Side effects that usually do not require medical attention (report to your doctor or health care professional if they continue or are bothersome): -blurred vision -constipation -dizziness -dry mouth -tiredness This list may not describe all possible side effects. Call your doctor for medical advice about side effects. You may report side effects to FDA at 1-800-FDA-1088. Where should I keep my medicine? Keep out of the reach of children. Store at room temperature between 15 and 30 degrees C (59 and 86 degrees F). Throw away any unused medicine after the expiration date. NOTE: This sheet is a summary. It may not cover all possible information. If you have questions about this medicine, talk to your doctor, pharmacist, or health care provider.  2018 Elsevier/Gold Standard (2015-07-30 13:52:12)  Rizatriptan tablets What is this medicine? RIZATRIPTAN (rye za TRIP tan) is used to treat migraines with or without aura. An aura is a strange feeling or visual disturbance that warns you of an attack. It is not used to prevent migraines. This medicine may be used for other purposes; ask your health care provider or pharmacist if you have  questions. COMMON BRAND NAME(S): Maxalt What should I tell my health care provider before I take this medicine? They need to know if you have any of these conditions: -bowel disease or colitis -diabetes -family history of heart disease -fast or irregular heart beat -heart or blood vessel disease, angina (chest pain), or previous heart attack -high blood pressure -high cholesterol -history of stroke, transient ischemic attacks (TIAs or mini-strokes), or intracranial bleeding -kidney or liver disease -overweight -poor circulation -postmenopausal or surgical removal of uterus and ovaries -Raynaud's disease -seizure disorder -an unusual or allergic reaction to rizatriptan, other medicines, foods, dyes, or preservatives -pregnant or trying to get pregnant -breast-feeding How should I use this medicine? This medicine is taken by mouth with a glass of water. Follow the directions on the prescription label. This medicine is taken at the first symptoms of a migraine. It is not for everyday use. If your migraine headache returns after one dose, you can take another dose as directed. You must leave at least 2 hours between doses, and do not take more than 30 mg total in 24 hours. If there is no improvement at all after the first dose, do not take a second dose without talking to your doctor or health care professional. Do not take your medicine more often than directed. Talk to your pediatrician regarding the use of this medicine in children. While this drug may be prescribed for children as young as 6 years for selected conditions, precautions do apply. Overdosage: If you think you have taken too much of this medicine contact a poison control center or emergency room at once. NOTE: This medicine is only for you. Do not share this medicine with others. What if I miss a dose? This does not apply; this medicine is not for regular use. What may interact with this medicine? Do not take this medicine  with any of the following medicines: -amphetamine, dextroamphetamine or cocaine -dihydroergotamine, ergotamine, ergoloid mesylates, methysergide, or ergot-type medication - do not take within 24 hours of taking rizatriptan -feverfew -MAOIs like Carbex, Eldepryl, Marplan, Nardil, and Parnate - do not take rizatriptan within 2 weeks of stopping MAOI therapy. -other migraine medicines like almotriptan, eletriptan, naratriptan, sumatriptan, zolmitriptan - do not take within 24 hours of taking rizatriptan -tryptophan This medicine may also interact with the following medications: -medicines for mental depression, anxiety or mood problems -propranolol This list may not describe all possible interactions. Give your health care provider a list of all the medicines, herbs, non-prescription drugs, or dietary supplements you use. Also tell them if you smoke, drink alcohol, or use illegal drugs. Some items may interact with your medicine. What should I watch for while using this medicine? Only take this medicine for a migraine headache. Take it if you get warning symptoms or at the start of a migraine attack. It is not for regular use to prevent migraine attacks. You may get drowsy or dizzy. Do not drive, use machinery, or do anything that needs mental alertness until you know how this medicine affects you. To reduce dizzy or fainting spells, do not sit or stand up quickly, especially if you are an  older patient. Alcohol can increase drowsiness, dizziness and flushing. Avoid alcoholic drinks. Smoking cigarettes may increase the risk of heart-related side effects from using this medicine. If you take migraine medicines for 10 or more days a month, your migraines may get worse. Keep a diary of headache days and medicine use. Contact your healthcare professional if your migraine attacks occur more frequently. What side effects may I notice from receiving this medicine? Side effects that you should report to your  doctor or health care professional as soon as possible: -allergic reactions like skin rash, itching or hives, swelling of the face, lips, or tongue -fast, slow, or irregular heart beat -increased or decreased blood pressure -seizures -severe stomach pain and cramping, bloody diarrhea -signs and symptoms of a blood clot such as breathing problems; changes in vision; chest pain; severe, sudden headache; pain, swelling, warmth in the leg; trouble speaking; sudden numbness or weakness of the face, arm or leg -tingling, pain, or numbness in the face, hands, or feet Side effects that usually do not require medical attention (report to your doctor or health care professional if they continue or are bothersome): -drowsiness -dry mouth -feeling warm, flushing, or redness of the face -headache -muscle cramps, pain -nausea, vomiting -unusually weak or tired This list may not describe all possible side effects. Call your doctor for medical advice about side effects. You may report side effects to FDA at 1-800-FDA-1088. Where should I keep my medicine? Keep out of the reach of children. Store at room temperature between 15 and 30 degrees C (59 and 86 degrees F). Keep container tightly closed. Throw away any unused medicine after the expiration date. NOTE: This sheet is a summary. It may not cover all possible information. If you have questions about this medicine, talk to your doctor, pharmacist, or health care provider.  2018 Elsevier/Gold Standard (2013-06-20 10:16:39)  Tizanidine tablets or capsules What is this medicine? TIZANIDINE (tye ZAN i deen) helps to relieve muscle spasms. It may be used to help in the treatment of multiple sclerosis and spinal cord injury. This medicine may be used for other purposes; ask your health care provider or pharmacist if you have questions. COMMON BRAND NAME(S): Zanaflex What should I tell my health care provider before I take this medicine? They need to know  if you have any of these conditions: -kidney disease -liver disease -low blood pressure -mental disorder -an unusual or allergic reaction to tizanidine, other medicines, lactose (tablets only), foods, dyes, or preservatives -pregnant or trying to get pregnant -breast-feeding How should I use this medicine? Take this medicine by mouth with a full glass of water. Take this medicine on an empty stomach, at least 30 minutes before or 2 hours after food. Do not take with food unless you talk with your doctor. Follow the directions on the prescription label. Take your medicine at regular intervals. Do not take your medicine more often than directed. Do not stop taking except on your doctor's advice. Suddenly stopping the medicine can be very dangerous. Talk to your pediatrician regarding the use of this medicine in children. Patients over 70 years old may have a stronger reaction and need a smaller dose. Overdosage: If you think you have taken too much of this medicine contact a poison control center or emergency room at once. NOTE: This medicine is only for you. Do not share this medicine with others. What if I miss a dose? If you miss a dose, take it as soon as you can. If  it is almost time for your next dose, take only that dose. Do not take double or extra doses. What may interact with this medicine? Do not take this medicine with any of the following medications: -ciprofloxacin -cisapride -dofetilide -dronedarone -fluvoxamine -narcotic medicines for cough -pimozide -thiabendazole -thioridazine -ziprasidone This medicine may also interact with the following medications: -acyclovir -alcohol -antihistamines for allergy, cough and cold -baclofen -certain antibiotics like levofloxacin, ofloxacin -certain medicines for anxiety or sleep -certain medicines for blood pressure, heart disease, irregular heart beat -certain medicines for depression like amitriptyline, fluoxetine,  sertraline -certain medicines for seizures like phenobarbital, primidone -certain medicines for stomach problems like cimetidine, famotidine -female hormones, like estrogens or progestins and birth control pills, patches, rings, or injections -general anesthetics like halothane, isoflurane, methoxyflurane, propofol -local anesthetics like lidocaine, pramoxine, tetracaine -medicines that relax muscles for surgery -narcotic medicines for pain -other medicines that prolong the QT interval (cause an abnormal heart rhythm) -phenothiazines like chlorpromazine, mesoridazine, prochlorperazine -ticlopidine -zileuton This list may not describe all possible interactions. Give your health care provider a list of all the medicines, herbs, non-prescription drugs, or dietary supplements you use. Also tell them if you smoke, drink alcohol, or use illegal drugs. Some items may interact with your medicine. What should I watch for while using this medicine? Tell your doctor or health care professional if your symptoms do not start to get better or if they get worse. You may get drowsy or dizzy. Do not drive, use machinery, or do anything that needs mental alertness until you know how this medicine affects you. Do not stand or sit up quickly, especially if you are an older patient. This reduces the risk of dizzy or fainting spells. Alcohol may interfere with the effect of this medicine. Avoid alcoholic drinks. If you are taking another medicine that also causes drowsiness, you may have more side effects. Give your health care provider a list of all medicines you use. Your doctor will tell you how much medicine to take. Do not take more medicine than directed. Call emergency for help if you have problems breathing or unusual sleepiness. Your mouth may get dry. Chewing sugarless gum or sucking hard candy, and drinking plenty of water may help. Contact your doctor if the problem does not go away or is severe. What side  effects may I notice from receiving this medicine? Side effects that you should report to your doctor or health care professional as soon as possible: -allergic reactions like skin rash, itching or hives, swelling of the face, lips, or tongue -breathing problems -hallucinations -signs and symptoms of liver injury like dark yellow or brown urine; general ill feeling or flu-like symptoms; light-colored stools; loss of appetite; nausea; right upper quadrant belly pain; unusually weak or tired; yellowing of the eyes or skin -signs and symptoms of low blood pressure like dizziness; feeling faint or lightheaded, falls; unusually weak or tired -unusually slow heartbeat -unusually weak or tired Side effects that usually do not require medical attention (report to your doctor or health care professional if they continue or are bothersome): -blurred vision -constipation -dizziness -dry mouth -tiredness This list may not describe all possible side effects. Call your doctor for medical advice about side effects. You may report side effects to FDA at 1-800-FDA-1088. Where should I keep my medicine? Keep out of the reach of children. Store at room temperature between 15 and 30 degrees C (59 and 86 degrees F). Throw away any unused medicine after the expiration date. NOTE: This  sheet is a summary. It may not cover all possible information. If you have questions about this medicine, talk to your doctor, pharmacist, or health care provider.  2018 Elsevier/Gold Standard (2015-07-30 13:52:12)

## 2017-12-21 ENCOUNTER — Encounter: Payer: Self-pay | Admitting: Neurology

## 2018-05-17 ENCOUNTER — Ambulatory Visit: Payer: 59 | Admitting: Neurology

## 2018-07-11 ENCOUNTER — Encounter: Payer: Self-pay | Admitting: Neurology

## 2018-07-11 ENCOUNTER — Ambulatory Visit: Payer: 59 | Admitting: Neurology

## 2018-07-11 VITALS — BP 112/79 | HR 66 | Ht 66.0 in | Wt 212.0 lb

## 2018-07-11 DIAGNOSIS — G43709 Chronic migraine without aura, not intractable, without status migrainosus: Secondary | ICD-10-CM

## 2018-07-11 MED ORDER — RIZATRIPTAN BENZOATE 10 MG PO TABS: 10.0000 mg | ORAL_TABLET | ORAL | 11 refills | Status: DC | PRN

## 2018-07-11 MED ORDER — GALCANEZUMAB-GNLM 120 MG/ML ~~LOC~~ SOAJ: 120.0000 mg | SUBCUTANEOUS | 0 refills | Status: DC

## 2018-07-11 MED ORDER — TOPIRAMATE 200 MG PO TABS: 200.0000 mg | ORAL_TABLET | Freq: Every day | ORAL | 4 refills | Status: DC

## 2018-07-11 MED ORDER — TIZANIDINE HCL 4 MG PO TABS: 4.0000 mg | ORAL_TABLET | Freq: Four times a day (QID) | ORAL | 11 refills | Status: DC | PRN

## 2018-07-11 NOTE — Patient Instructions (Signed)
Galcanezumab: Patient drug information Access [employer] Online here. Copyright 1978-2019 [employer] All rights reserved. (For additional information see "Galcanezumab: Drug information") Brand Names: [place]  Emgality;  Emgality (300 MG Dose)  What is this drug used for?   It is used to prevent migraine headaches.   It is used to treat cluster headaches.  What do I need to tell my doctor BEFORE I take this drug?   If you have an allergy to this drug or any part of this drug.   If you are allergic to any drugs like this one, any other drugs, foods, or other substances. Tell your doctor about the allergy and what signs you had, like rash; hives; itching; shortness of breath; wheezing; cough; swelling of face, lips, tongue, or throat; or any other signs.   This drug may interact with other drugs or health problems.   Tell your doctor and pharmacist about all of your drugs (prescription or OTC, natural products, vitamins) and health problems. You must check to make sure that it is safe for you to take this drug with all of your drugs and health problems. Do not start, stop, or change the dose of any drug without checking with your doctor.  What are some things I need to know or do while I take this drug?   Tell all of your health care providers that you take this drug. This includes your doctors, nurses, pharmacists, and dentists.   Do not share pen or cartridge devices with another person even if the needle has been changed. Sharing these devices may pass infections from one person to another. This includes infections you may not know you have.   Tell your doctor if you are pregnant, plan on getting pregnant, or are breast-feeding. You will need to talk about the benefits and risks to you and the baby.  What are some side effects that I need to call my doctor about right away?   WARNING/CAUTION: Even though it may be rare, some people may have very bad and sometimes deadly side effects when  taking a drug. Tell your doctor or get medical help right away if you have any of the following signs or symptoms that may be related to a very bad side effect:   Signs of an allergic reaction, like rash; hives; itching; red, swollen, blistered, or peeling skin with or without fever; wheezing; tightness in the chest or throat; trouble breathing, swallowing, or talking; unusual hoarseness; or swelling of the mouth, face, lips, tongue, or throat.  What are some other side effects of this drug?   All drugs may cause side effects. However, many people have no side effects or only have minor side effects. Call your doctor or get medical help if any of these side effects or any other side effects bother you or do not go away:   Irritation where the shot is given.   These are not all of the side effects that may occur. If you have questions about side effects, call your doctor. Call your doctor for medical advice about side effects.   You may report side effects to your national health agency.  How is this drug best taken?   Use this drug as ordered by your doctor. Read all information given to you. Follow all instructions closely.   It is given as a shot into the fatty part of the skin in the upper arm, thigh, buttocks, or stomach area.   If you will be giving yourself   the shot, your doctor or nurse will teach you how to give the shot.   Wash your hands before use.   If stored in a refrigerator, let this drug come to room temperature before using it. Leave it at room temperature for at least 30 minutes. Do not heat this drug.   Do not shake.   Do not give into skin that is irritated, bruised, red, infected, or scarred.   Do not give into skin within 2 inches of the belly button.   If your dose is more than 1 injection, you may give it in the same body part. Make sure you do not give injections in the same spot you used for the other injections.   Do not rub the site where you give the shot.   Do not  use if the solution is cloudy, leaking, or has particles.   This drug is colorless to slightly yellow or slightly brown. Do not use if the solution changes color.   Move the site where you give the shot with each shot.   If you drop this drug on a hard surface, do not use it.   Each prefilled pen or syringe is for one use only.   Throw away needles in a needle/sharp disposal box. Do not reuse needles or other items. When the box is full, follow all local rules for getting rid of it. Talk with a doctor or pharmacist if you have any questions.  What do I do if I miss a dose?   Take a missed dose as soon as you think about it.   After taking a missed dose, start a new schedule based on when the dose is taken.  How do I store and/or throw out this drug?   Store in a refrigerator. Do not freeze.   Store in the carton to protect from light.   If needed, you may store at room temperature for up to 7 days. Write down the date you take this drug out of the refrigerator. If stored at room temperature and not used within 7 days, throw this drug away.   Do not put this drug back in the refrigerator after it has been stored at room temperature.   Keep all drugs in a safe place. Keep all drugs out of the reach of children and pets.   Throw away unused or expired drugs. Do not flush down a toilet or pour down a drain unless you are told to do so. Check with your pharmacist if you have questions about the best way to throw out drugs. There may be drug take-back programs in your area.  General drug facts   If your symptoms or health problems do not get better or if they become worse, call your doctor.   Do not share your drugs with others and do not take anyone else's drugs.   Keep a list of all your drugs (prescription, natural products, vitamins, OTC) with you. Give this list to your doctor.   Talk with the doctor before starting any new drug, including prescription or OTC, natural products, or vitamins.    Some drugs may have another patient information leaflet. If you have any questions about this drug, please talk with your doctor, nurse, pharmacist, or other health care provider.   If you think there has been an overdose, call your poison control center or get medical care right away. Be ready to tell or show what was taken, how much, and when   it happened.  Use of UpToDate is subject to the Subscription and License Agreement.

## 2018-07-11 NOTE — Progress Notes (Signed)
[facility]    Provider:  Dr Lucia Gaskins Referring Provider: Dr. Blossom Hoops from [facility] Primary Care Physician:  Dr. Blossom Hoops from [facility]  CC:  Chronic migraine  intervla history 07/11/2018: Patient is here for follow-up for migraines.  She is been on Topamax for years and she is tried atenolol and multiple other medications.  Migraines are unilateral, pulsating, throbbing, with light and sound sensitivity, nausea and vomiting 15 headache days a month and 10 are migrainous for years.  Had a long discussion today about possibilities including another oral preventatives, Botox for migraines, CGRP medications.  She is going to try CGRP medications gave her some samples today.  HPI:  Lisa Weaver is a 37 y.o. female here as a referral from Dr. No ref. provider found for migraines. She has been on Topamax for years. Started at the age of 56. Migraines are sharp and shooting, starts on one side of the head but can be either. It radiates through the eye to the back of the head. She gets neck pain, eye blurriness. Pounding and throbbing, light and sound sensitivity. Sun glasses help. Light makes it worse. Needs a quiet room. No significant nausea or vomiting maybe rare nausea. She has frequent mild headaches. No medication overuse. 20 headache days a month, she will have 2-3 severe migraine days a month. Can last 24 hours or longer without treatment. 7-8 moderate-mild migraine days, the rest are mild low-level headache. She had tubes removed no risk of pregnancy. She used to be on 300mg  and that worked better. No signs or symptoms of sleep apnea. She has done food diary and everything, no food triggers, she watches her sleep. The night before her period she will get a migraine. She has allergy induced infrequent asthma.  No known history of migraines.  Meds tried: Atenolol, Topiramate  Reviewed notes, labs and imaging from outside physicians, which  showed: Reviewed notes.  Patient has been on topiramate for many years except when she was pregnant, baby was born in March 2018.  In pregnancy she would use Tylenol a few times a week no medication overuse.  She stopped taking Topamax during pregnancy.  She did well during pregnancy however a week after delivery she started getting more intense headaches.  Miller to those headache she has had throughout the years.  At her last visit in May 2018 she stated 10 severe headache days, 8 moderate and 5 mild days a month.  The headache throbs with associated light and sound sensitivity worsened with movement.  She is tried ibuprofen in the past.  In the past she has been on Topamax up to 300 mg a day which was of benefit.  She tried Imitrex and was transiently making her dizzy.  She does have an allergy to sulfa.  Also allergy to Cefzil and Axert.  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  No known history of migraines.  Diagnosed with migraine and episodic tension type headaches.  He started her on Topamax at this last appointment.   Tried: Axert, Imitrex Bmp in the past unremarkable  Review of Systems: Patient complains of symptoms per HPI as well as the following symptoms: blurred vision, eye pain, headache, dizziness. Pertinent negatives and positives per HPI. All others negative.   Social History   Socioeconomic History  . Marital status: Single    Spouse name: Not on file  . Number of children: 2  . Years of education: Not on file  .  Highest education level: Some college, no degree  Occupational History  . Not on file  Social Needs  . Financial resource strain: Not on file  . Food insecurity:    Worry: Not on file    Inability: Not on file  . Transportation needs:    Medical: Not on file    Non-medical: Not on file  Tobacco Use  . Smoking status: Never Smoker  . Smokeless tobacco: Never Used  Substance and Sexual Activity  . Alcohol use: No     Comment: socially  . Drug use: No  . Sexual activity: Yes    Birth control/protection: None  Lifestyle  . Physical activity:    Days per week: Not on file    Minutes per session: Not on file  . Stress: Not on file  Relationships  . Social connections:    Talks on phone: Not on file    Gets together: Not on file    Attends religious service: Not on file    Active member of club or organization: Not on file    Attends meetings of clubs or organizations: Not on file    Relationship status: Not on file  . Intimate partner violence:    Fear of current or ex partner: Not on file    Emotionally abused: Not on file    Physically abused: Not on file    Forced sexual activity: Not on file  Other Topics Concern  . Not on file  Social History Narrative   Lives at home with her parents and her children   Right handed   Caffeine: occasional     Family History  Problem Relation Age of Onset  . Hypertrophic cardiomyopathy Brother   . Heart disease Brother        Hypertrophic cadiomyopathy  . Cancer Mother 25       Ovarian  . Other Neg Hx     Past Medical History:  Diagnosis Date  . Asthma   . Blood dyscrasia    MTHFR- genetic clotting issue  . Infection    urinary tract infection  . MTHFR mutation (HCC)   . Spinal headache     Past Surgical History:  Procedure Laterality Date  . BILATERAL SALPINGECTOMY Bilateral 2018  . CESAREAN SECTION    . CESAREAN SECTION N/A 01/22/2017   Procedure: CESAREAN SECTION;  Surgeon: Silverio Lay, MD;  Location: [facility];  Service: Obstetrics;  Laterality: N/A;  . CHOLECYSTECTOMY    . TONSILLECTOMY AND ADENOIDECTOMY    . WISDOM TOOTH EXTRACTION      Current Outpatient Medications  Medication Sig Dispense Refill  . albuterol (PROVENTIL HFA;VENTOLIN HFA) 108 (90 BASE) MCG/ACT inhaler Inhale 2 puffs into the lungs every 6 (six) hours as needed for wheezing or shortness of breath.     Marland Kitchen ibuprofen (ADVIL,MOTRIN) 600 MG tablet Take  600 mg by mouth every 6 (six) hours as needed.    . rizatriptan (MAXALT) 10 MG tablet Take 1 tablet (10 mg total) by mouth as needed for migraine. May repeat in 2 hours if needed. Max twice in one day. 10 tablet 11  . tiZANidine (ZANAFLEX) 4 MG tablet Take 1 tablet (4 mg total) by mouth every 6 (six) hours as needed for muscle spasms. 30 tablet 11  . topiramate (TOPAMAX) 200 MG tablet Take 1 tablet (200 mg total) by mouth daily. 90 tablet 4   No current facility-administered medications for this visit.     Allergies as of 07/11/2018 -  Review Complete 07/11/2018  Allergen Reaction Noted  . Atenolol  07/11/2018  . Cefprozil Hives 12/10/2011  . Sulfa antibiotics Hives 01/10/2017    Vitals: BP 112/79 (BP Location: Right Arm, Patient Position: Sitting)   Pulse 66   Ht 5\' 6"  (1.676 m)   Wt 212 lb (96.2 kg)   LMP 06/27/2018 (Exact Date)   BMI 34.22 kg/m  Last Weight:  Wt Readings from Last 1 Encounters:  07/11/18 212 lb (96.2 kg)   Last Height:   Ht Readings from Last 1 Encounters:  07/11/18 5\' 6"  (1.676 m)    Physical exam: Exam: Gen: NAD, conversant, well nourised, obese, well groomed                     CV: RRR, no MRG. No Carotid Bruits. No peripheral edema, warm, nontender Eyes: Conjunctivae clear without exudates or hemorrhage  Neuro: Detailed Neurologic Exam  Speech:    Speech is normal; fluent and spontaneous with normal comprehension.  Cognition:    The patient is oriented to person, place, and time;     recent and remote memory intact;     language fluent;     normal attention, concentration,     fund of knowledge Cranial Nerves:    The pupils are equal, round, and reactive to light. The fundi are normal and spontaneous venous pulsations are present. Visual fields are full to finger confrontation. Extraocular movements are intact. Trigeminal sensation is intact and the muscles of mastication are normal. The face is symmetric. The palate elevates in the midline.  Hearing intact. Voice is normal. Shoulder shrug is normal. The tongue has normal motion without fasciculations.   Coordination:    Normal finger to nose and heel to shin. Normal rapid alternating movements.   Gait:    Heel-toe and tandem gait are normal.   Motor Observation:    No asymmetry, no atrophy, and no involuntary movements noted. Tone:    Normal muscle tone.    Posture:    Posture is normal. normal erect    Strength:    Strength is V/V in the upper and lower limbs.      Sensation: intact to LT     Reflex Exam:  DTR's:    Deep tendon reflexes in the upper and lower extremities are normal bilaterally.   Toes:    The toes are downgoing bilaterally.   Clonus:    Clonus is absent.      Assessment/Plan:  37 year old with chronic migraines  Acute management: continue Rizatriptan (Maxalt): Please take one tablet at the onset of your headache. If it does not improve the symptoms please take one additional tablet in 2 hours. Do not take more then 2 tablets in 24hrs. Do not take use more then 2 to 3 times in a week. May also use Tizanidine.   Meds ordered this encounter  Medications  . Galcanezumab-gnlm (EMGALITY) 120 MG/ML SOAJ    Sig: Inject 120 mg into the skin every 30 (thirty) days. [id number] aa 3/21    Dispense:  4 pen    Refill:  0  . rizatriptan (MAXALT) 10 MG tablet    Sig: Take 1 tablet (10 mg total) by mouth as needed for migraine. May repeat in 2 hours if needed. Max twice in one day.    Dispense:  10 tablet    Refill:  11  . topiramate (TOPAMAX) 200 MG tablet    Sig: Take 1 tablet (200 mg total) by  mouth daily.    Dispense:  90 tablet    Refill:  4  . tiZANidine (ZANAFLEX) 4 MG tablet    Sig: Take 1 tablet (4 mg total) by mouth every 6 (six) hours as needed for muscle spasms.    Dispense:  30 tablet    Refill:  11      Magnesium citrate 400mg  to 600mg  daily, riboflavin 400mg  daily, Coenzyme Q 10 100mg  three times daily Consider joining Facebook  group [facility].  Dicussed: To prevent or relieve headaches, try the following: Cool Compress. Lie down and place a cool compress on your head.  Avoid headache triggers. If certain foods or odors seem to have triggered your migraines in the past, avoid them. A headache diary might help you identify triggers.  Include physical activity in your daily routine. Try a daily walk or other moderate aerobic exercise.  Manage stress. Find healthy ways to cope with the stressors, such as delegating tasks on your to-do list.  Practice relaxation techniques. Try deep breathing, yoga, massage and visualization.  Eat regularly. Eating regularly scheduled meals and maintaining a healthy diet might help prevent headaches. Also, drink plenty of fluids.  Follow a regular sleep schedule. Sleep deprivation might contribute to headaches Consider biofeedback. With this mind-body technique, you learn to control certain bodily functions - such as muscle tension, heart rate and blood pressure - to prevent headaches or reduce headache pain.    Proceed to emergency room if you experience new or worsening symptoms or symptoms do not resolve, if you have new neurologic symptoms or if headache is severe, or for any concerning symptom.   Provided education and documentation from American headache Society toolbox including articles on: chronic migraine medication overuse headache, chronic migraines, prevention of migraines, behavioral and other nonpharmacologic treatments for headache.  Cc: Dr. Blossom Hoops from [facility]  Naomie Dean, MD  [facility] [address] [place], [place] [address]  Phone [phone] Fax [phone]  A total of 25 minutes was spent face-to-face with this patient. Over half this time was spent on counseling patient on the  1. Chronic migraine without aura without status migrainosus, not intractable     diagnosis and  different diagnostic and therapeutic options, counseling and coordination of care, risks ans benefits of management, compliance, or risk factor reduction and education.

## 2018-11-16 ENCOUNTER — Telehealth: Payer: Self-pay | Admitting: Neurology

## 2018-11-16 NOTE — Telephone Encounter (Signed)
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 Can we initiate botox please per patient and give her a call? Thank you.

## 2018-11-16 NOTE — Telephone Encounter (Signed)
Noted. Patient will need consent at her first botox appt.

## 2018-11-21 ENCOUNTER — Other Ambulatory Visit: Payer: Self-pay | Admitting: *Deleted

## 2018-11-21 MED ORDER — ONABOTULINUMTOXINA 100 UNITS IJ SOLR: INTRAMUSCULAR | 3 refills | Status: DC

## 2018-11-21 NOTE — Progress Notes (Signed)
botox per v.o. Dr. Lucia Gaskins.

## 2018-11-21 NOTE — Telephone Encounter (Signed)
Done

## 2018-11-21 NOTE — Telephone Encounter (Signed)
I called to schedule the patient but she did not answer so I left a VM asking her to call me back.     Toma Copier could you send an rx for the patients Botox to [facility] in [place]?

## 2018-11-22 NOTE — Telephone Encounter (Signed)
Noted, thank you

## 2018-12-01 NOTE — Telephone Encounter (Signed)
I called the patient and left a VM asking her to call me back. DW

## 2018-12-01 NOTE — Telephone Encounter (Signed)
Pt has returned the call to Danielle, she is asking for a call back °

## 2019-01-09 ENCOUNTER — Ambulatory Visit: Payer: 59 | Admitting: Neurology

## 2019-01-11 ENCOUNTER — Ambulatory Visit (INDEPENDENT_AMBULATORY_CARE_PROVIDER_SITE_OTHER): Payer: 59 | Admitting: Neurology

## 2019-01-11 ENCOUNTER — Other Ambulatory Visit: Payer: Self-pay

## 2019-01-11 ENCOUNTER — Encounter: Payer: Self-pay | Admitting: Neurology

## 2019-01-11 VITALS — BP 108/76 | HR 72 | Ht 66.0 in | Wt 207.0 lb

## 2019-01-11 DIAGNOSIS — G43709 Chronic migraine without aura, not intractable, without status migrainosus: Secondary | ICD-10-CM

## 2019-01-11 NOTE — Progress Notes (Signed)
Error, patient was supposed to come for botox. Canceled appointment and rescheduled.

## 2019-01-12 ENCOUNTER — Encounter: Payer: Self-pay | Admitting: Neurology

## 2019-01-12 NOTE — Telephone Encounter (Signed)
I called to check status but it is still pending insurance at the pharmacy. It will need to run through pharmacy benefits.They have marked it urgent. DW

## 2019-01-17 ENCOUNTER — Telehealth: Payer: Self-pay | Admitting: Neurology

## 2019-01-17 NOTE — Telephone Encounter (Signed)
Patient is aware you are out of the office. Patient would like for you to call her about Botox Saving's program. Thanks Annabelle Harman .

## 2019-01-17 NOTE — Progress Notes (Deleted)
 Consent Form Botulism Toxin Injection For Chronic Migraine    Reviewed orally with patient, additionally signature is on file:  Botulism toxin has been approved by the Federal drug administration for treatment of chronic migraine. Botulism toxin does not cure chronic migraine and it may not be effective in some patients.  The administration of botulism toxin is accomplished by injecting a small amount of toxin into the muscles of the neck and head. Dosage must be titrated for each individual. Any benefits resulting from botulism toxin tend to wear off after 3 months with a repeat injection required if benefit is to be maintained. Injections are usually done every 3-4 months with maximum effect peak achieved by about 2 or 3 weeks. Botulism toxin is expensive and you should be sure of what costs you will incur resulting from the injection.  The side effects of botulism toxin use for chronic migraine may include:   -Transient, and usually mild, facial weakness with facial injections  -Transient, and usually mild, head or neck weakness with head/neck injections  -Reduction or loss of forehead facial animation due to forehead muscle weakness  -Eyelid drooping  -Dry eye  -Pain at the site of injection or bruising at the site of injection  -Double vision  -Potential unknown long term risks   Contraindications: You should not have Botox if you are pregnant, nursing, allergic to albumin, have an infection, skin condition, or muscle weakness at the site of the injection, or have myasthenia gravis, Lambert-Eaton syndrome, or ALS.  It is also possible that as with any injection, there may be an allergic reaction or no effect from the medication. Reduced effectiveness after repeated injections is sometimes seen and rarely infection at the injection site may occur. All care will be taken to prevent these side effects. If therapy is given over a long time, atrophy and wasting in the muscle injected may  occur. Occasionally the patient's become refractory to treatment because they develop antibodies to the toxin. In this event, therapy needs to be modified.  I have read the above information and consent to the administration of botulism toxin.    BOTOX PROCEDURE NOTE FOR MIGRAINE HEADACHE  Contraindications and precautions discussed with patient(above). Aseptic procedure was observed and patient tolerated procedure. Procedure performed by Dr. Toni Ahern  The condition has existed for more than 6 months, and pt does not have a diagnosis of ALS, Myasthenia Gravis or Lambert-Eaton Syndrome.  Risks and benefits of injections discussed and pt agrees to proceed with the procedure.  Written consent obtained  These injections are medically necessary. Pt  receives good benefits from these injections. These injections do not cause sedations or hallucinations which the oral therapies may cause.   Description of procedure:  The patient was placed in a sitting position. The standard protocol was used for Botox as follows, with 5 units of Botox injected at each site:  -Procerus muscle, midline injection  -Corrugator muscle, bilateral injection  -Frontalis muscle, bilateral injection, with 2 sites each side, medial injection was performed in the upper one third of the frontalis muscle, in the region vertical from the medial inferior edge of the superior orbital rim. The lateral injection was again in the upper one third of the forehead vertically above the lateral limbus of the cornea, 1.5 cm lateral to the medial injection site.  -Temporalis muscle injection, 4 sites, bilaterally. The first injection was 3 cm above the tragus of the ear, second injection site was 1.5 cm to 3   cm up from the first injection site in line with the tragus of the ear. The third injection site was 1.5-3 cm forward between the first 2 injection sites. The fourth injection site was 1.5 cm posterior to the second injection site.  5th site laterally in the temporalis  muscleat the level of the outer canthus.  -Occipitalis muscle injection, 3 sites, bilaterally. The first injection was done one half way between the occipital protuberance and the tip of the mastoid process behind the ear. The second injection site was done lateral and superior to the first, 1 fingerbreadth from the first injection. The third injection site was 1 fingerbreadth superiorly and medially from the first injection site.  -Cervical paraspinal muscle injection, 2 sites, bilateral knee first injection site was 1 cm from the midline of the cervical spine, 3 cm inferior to the lower border of the occipital protuberance. The second injection site was 1.5 cm superiorly and laterally to the first injection site.  -Trapezius muscle injection was performed at 3 sites, bilaterally. The first injection site was in the upper trapezius muscle halfway between the inflection point of the neck, and the acromion. The second injection site was one half way between the acromion and the first injection site. The third injection was done between the first injection site and the inflection point of the neck.   Will return for repeat injection in 3 months.   A 155 units of Botox was used, any Botox not injected was wasted. The patient tolerated the procedure well, there were no complications of the above procedure.  

## 2019-01-19 ENCOUNTER — Telehealth: Payer: Self-pay | Admitting: *Deleted

## 2019-01-19 NOTE — Telephone Encounter (Signed)
Called patient and informed her that her Botox appointment tomorrow had to be rescheduled to Monday. Advised her the provider will be out of office tomorrow. She stated "that will be fine." I gave her appt time for Monday. Patient verbalized understanding, appreciation.

## 2019-01-20 ENCOUNTER — Telehealth: Payer: Self-pay

## 2019-01-20 ENCOUNTER — Ambulatory Visit: Payer: Self-pay | Admitting: Family Medicine

## 2019-01-20 NOTE — Telephone Encounter (Signed)
Unable to get in contact with the patient. I left voicemail letting her know that her appointment with Amy on 01/23/2019 has been cancelled.

## 2019-01-23 ENCOUNTER — Ambulatory Visit: Payer: Self-pay | Admitting: Family Medicine

## 2019-01-23 NOTE — Telephone Encounter (Signed)
I called the patient back to discuss the savings program but she did not answer so I left a VM asking her to call me back. DW

## 2019-01-30 NOTE — Telephone Encounter (Signed)
I spoke with the patient who stated that she had already signed up for the reimbursement program. Patient also wanted to confirm that she was not being charged for a no show for her last apt because she did come in to the office but. Dr. Lucia Gaskins told her she did not need to be seen. She has received some no show letters and she is concerned. I told her that I would make billing aware that she did come to the apt. No need to return call.

## 2019-02-15 ENCOUNTER — Telehealth: Payer: Self-pay | Admitting: Family Medicine

## 2019-02-22 ENCOUNTER — Ambulatory Visit: Payer: 59 | Admitting: Family Medicine

## 2019-03-22 ENCOUNTER — Ambulatory Visit: Payer: 59 | Admitting: Family Medicine

## 2019-03-23 ENCOUNTER — Encounter: Payer: Self-pay | Admitting: Family Medicine

## 2019-03-23 ENCOUNTER — Other Ambulatory Visit: Payer: Self-pay

## 2019-03-23 ENCOUNTER — Ambulatory Visit (INDEPENDENT_AMBULATORY_CARE_PROVIDER_SITE_OTHER): Payer: 59 | Admitting: Family Medicine

## 2019-03-23 VITALS — Temp 96.2°F

## 2019-03-23 DIAGNOSIS — G43709 Chronic migraine without aura, not intractable, without status migrainosus: Secondary | ICD-10-CM | POA: Diagnosis not present

## 2019-03-23 NOTE — Progress Notes (Signed)
Botox- 100 units x 2 vials Lot: [id number] Expiration: 10/2021 NDC: 3546-5681-27  Bacteriostatic 0.9% Sodium Chloride- 70mL total Lot: [id number] Expiration: 08/03/2019 NDC: 7494-4967-59  Dx: F63.846 S/P

## 2019-03-23 NOTE — Progress Notes (Signed)
 Consent Form Botulism Toxin Injection For Chronic Migraine    Reviewed orally with patient, additionally signature is on file:  Botulism toxin has been approved by the Federal drug administration for treatment of chronic migraine. Botulism toxin does not cure chronic migraine and it may not be effective in some patients.  The administration of botulism toxin is accomplished by injecting a small amount of toxin into the muscles of the neck and head. Dosage must be titrated for each individual. Any benefits resulting from botulism toxin tend to wear off after 3 months with a repeat injection required if benefit is to be maintained. Injections are usually done every 3-4 months with maximum effect peak achieved by about 2 or 3 weeks. Botulism toxin is expensive and you should be sure of what costs you will incur resulting from the injection.  The side effects of botulism toxin use for chronic migraine may include:   -Transient, and usually mild, facial weakness with facial injections  -Transient, and usually mild, head or neck weakness with head/neck injections  -Reduction or loss of forehead facial animation due to forehead muscle weakness  -Eyelid drooping  -Dry eye  -Pain at the site of injection or bruising at the site of injection  -Double vision  -Potential unknown long term risks   Contraindications: You should not have Botox if you are pregnant, nursing, allergic to albumin, have an infection, skin condition, or muscle weakness at the site of the injection, or have myasthenia gravis, Lambert-Eaton syndrome, or ALS.  It is also possible that as with any injection, there may be an allergic reaction or no effect from the medication. Reduced effectiveness after repeated injections is sometimes seen and rarely infection at the injection site may occur. All care will be taken to prevent these side effects. If therapy is given over a long time, atrophy and wasting in the muscle injected may  occur. Occasionally the patient's become refractory to treatment because they develop antibodies to the toxin. In this event, therapy needs to be modified.  I have read the above information and consent to the administration of botulism toxin.    BOTOX PROCEDURE NOTE FOR MIGRAINE HEADACHE  Contraindications and precautions discussed with patient(above). Aseptic procedure was observed and patient tolerated procedure. Procedure performed by Dr. Toni Ahern  The condition has existed for more than 6 months, and pt does not have a diagnosis of ALS, Myasthenia Gravis or Lambert-Eaton Syndrome.  Risks and benefits of injections discussed and pt agrees to proceed with the procedure.  Written consent obtained  These injections are medically necessary. Pt  receives good benefits from these injections. These injections do not cause sedations or hallucinations which the oral therapies may cause.   Description of procedure:  The patient was placed in a sitting position. The standard protocol was used for Botox as follows, with 5 units of Botox injected at each site:  -Procerus muscle, midline injection  -Corrugator muscle, bilateral injection  -Frontalis muscle, bilateral injection, with 2 sites each side, medial injection was performed in the upper one third of the frontalis muscle, in the region vertical from the medial inferior edge of the superior orbital rim. The lateral injection was again in the upper one third of the forehead vertically above the lateral limbus of the cornea, 1.5 cm lateral to the medial injection site.  -Temporalis muscle injection, 4 sites, bilaterally. The first injection was 3 cm above the tragus of the ear, second injection site was 1.5 cm to 3   cm up from the first injection site in line with the tragus of the ear. The third injection site was 1.5-3 cm forward between the first 2 injection sites. The fourth injection site was 1.5 cm posterior to the second injection site.  5th site laterally in the temporalis  muscleat the level of the outer canthus.  -Occipitalis muscle injection, 3 sites, bilaterally. The first injection was done one half way between the occipital protuberance and the tip of the mastoid process behind the ear. The second injection site was done lateral and superior to the first, 1 fingerbreadth from the first injection. The third injection site was 1 fingerbreadth superiorly and medially from the first injection site.  -Cervical paraspinal muscle injection, 2 sites, bilateral knee first injection site was 1 cm from the midline of the cervical spine, 3 cm inferior to the lower border of the occipital protuberance. The second injection site was 1.5 cm superiorly and laterally to the first injection site.  -Trapezius muscle injection was performed at 3 sites, bilaterally. The first injection site was in the upper trapezius muscle halfway between the inflection point of the neck, and the acromion. The second injection site was one half way between the acromion and the first injection site. The third injection was done between the first injection site and the inflection point of the neck.   Will return for repeat injection in 3 months.   A 155 units of Botox was used, any Botox not injected was wasted. The patient tolerated the procedure well, there were no complications of the above procedure.  

## 2019-03-24 NOTE — Progress Notes (Signed)
Made any corrections needed, and agree with history, physical, neuro exam,assessment and plan as stated.     Antonia Ahern, MD [facility]  

## 2019-05-24 ENCOUNTER — Other Ambulatory Visit: Payer: Self-pay | Admitting: Neurology

## 2019-06-07 ENCOUNTER — Other Ambulatory Visit: Payer: Self-pay | Admitting: Neurology

## 2019-08-23 ENCOUNTER — Other Ambulatory Visit: Payer: Self-pay

## 2019-08-23 DIAGNOSIS — Z20822 Contact with and (suspected) exposure to covid-19: Secondary | ICD-10-CM

## 2019-08-24 LAB — NOVEL CORONAVIRUS, NAA: SARS-CoV-2, NAA: NOT DETECTED

## 2019-08-28 ENCOUNTER — Other Ambulatory Visit: Payer: Self-pay | Admitting: Neurology

## 2019-09-27 ENCOUNTER — Other Ambulatory Visit: Payer: Self-pay | Admitting: Obstetrics and Gynecology

## 2019-09-29 ENCOUNTER — Other Ambulatory Visit: Payer: Self-pay | Admitting: Neurology

## 2019-11-01 ENCOUNTER — Other Ambulatory Visit: Payer: Self-pay | Admitting: Neurology

## 2019-11-10 ENCOUNTER — Other Ambulatory Visit: Payer: Self-pay | Admitting: Obstetrics and Gynecology

## 2019-12-02 ENCOUNTER — Other Ambulatory Visit: Payer: Self-pay | Admitting: Neurology

## 2019-12-29 ENCOUNTER — Other Ambulatory Visit: Payer: Self-pay | Admitting: Neurology

## 2020-01-19 ENCOUNTER — Ambulatory Visit: Payer: Self-pay | Attending: Internal Medicine

## 2020-01-19 DIAGNOSIS — Z23 Encounter for immunization: Secondary | ICD-10-CM

## 2020-01-19 NOTE — Progress Notes (Signed)
   Covid-19 Vaccination Clinic  Name:  Lisa Weaver    MRN: [id number] DOB: April 19, 1981  01/19/2020  Lisa Weaver was observed post Covid-19 immunization for 15 minutes without incident. She was provided with Vaccine Information Sheet and instruction to access the V-Safe system.   Lisa Weaver was instructed to call 911 with any severe reactions post vaccine: Marland Kitchen Difficulty breathing  . Swelling of face and throat  . A fast heartbeat  . A bad rash all over body  . Dizziness and weakness   Immunizations Administered    Name Date Dose VIS Date Route   Pfizer COVID-19 Vaccine 01/19/2020 12:09 PM 0.3 mL 10/13/2019 Intramuscular   Manufacturer: [employer], [employer]   Lot: [id number]   NDC: [address]-2

## 2020-01-26 ENCOUNTER — Other Ambulatory Visit: Payer: Self-pay | Admitting: Neurology

## 2020-02-13 ENCOUNTER — Ambulatory Visit: Payer: Self-pay | Attending: Internal Medicine

## 2020-02-13 DIAGNOSIS — Z23 Encounter for immunization: Secondary | ICD-10-CM

## 2020-02-13 NOTE — Progress Notes (Signed)
   Covid-19 Vaccination Clinic  Name:  Lisa Weaver    MRN: [id number] DOB: 1981/10/04  02/13/2020  [name] was observed post Covid-19 immunization for 15 minutes without incident. She was provided with Vaccine Information Sheet and instruction to access the V-Safe system.   [name] was instructed to call 911 with any severe reactions post vaccine: Marland Kitchen Difficulty breathing  . Swelling of face and throat  . A fast heartbeat  . A bad rash all over body  . Dizziness and weakness   Immunizations Administered    Name Date Dose VIS Date Route   Pfizer COVID-19 Vaccine 02/13/2020  1:07 PM 0.3 mL 10/13/2019 Intramuscular   Manufacturer: [employer], [employer]   Lot: [id number]   NDC: [address]-2

## 2020-02-29 ENCOUNTER — Other Ambulatory Visit: Payer: Self-pay | Admitting: Neurology

## 2020-02-29 NOTE — Telephone Encounter (Signed)
Pt either needs follow up appointment or to have primary care refill this for her. Will contact patient.

## 2020-02-29 NOTE — Telephone Encounter (Signed)
Spoke to pt, scheduled yearly f/u. Pt does not have PCP to fill medication right now.

## 2020-03-04 ENCOUNTER — Ambulatory Visit: Payer: Self-pay | Admitting: Adult Health

## 2020-06-07 ENCOUNTER — Other Ambulatory Visit: Payer: Self-pay | Admitting: Neurology

## 2020-09-04 ENCOUNTER — Other Ambulatory Visit: Payer: Self-pay

## 2020-09-04 ENCOUNTER — Encounter: Payer: Self-pay | Admitting: Family Medicine

## 2020-09-04 ENCOUNTER — Ambulatory Visit (INDEPENDENT_AMBULATORY_CARE_PROVIDER_SITE_OTHER): Payer: PRIVATE HEALTH INSURANCE | Admitting: Family Medicine

## 2020-09-04 VITALS — BP 115/80 | HR 81 | Ht 66.0 in | Wt 200.0 lb

## 2020-09-04 DIAGNOSIS — G43709 Chronic migraine without aura, not intractable, without status migrainosus: Secondary | ICD-10-CM

## 2020-09-04 MED ORDER — TOPIRAMATE 200 MG PO TABS: 200.0000 mg | ORAL_TABLET | Freq: Every day | ORAL | 3 refills | Status: DC

## 2020-09-04 MED ORDER — RIZATRIPTAN BENZOATE 10 MG PO TABS: 10.0000 mg | ORAL_TABLET | Freq: Two times a day (BID) | ORAL | 11 refills | Status: DC | PRN

## 2020-09-04 NOTE — Progress Notes (Addendum)
Chief Complaint  Patient presents with  . Follow-up    rm 16  . Migraine    pt said she has no changes from her last visit. Migraines are the same.     HISTORY OF PRESENT ILLNESS: Today 09/04/20  Lisa Weaver is a 39 y.o. female here today for follow up for migraines. She was last seen for her 1st Botox in 03/2019. She has continues topiramate 200mg  daily and rizatriptan PRN. She is having about 8-10 headache days per month with 2-3 being migrainous. She is not sure that she can remember to take monthly injections. She was given Emgality samples in the past but could not remember to take them monthly. Had dizziness and flu like symptoms with atenolol. Tubal in 2018.    HISTORY (copied from Dr 2019 note on 07/11/2018)  intervla history 07/11/2018: Patient is here for follow-up for migraines.  She is been on Topamax for years and she is tried atenolol and multiple other medications.  Migraines are unilateral, pulsating, throbbing, with light and sound sensitivity, nausea and vomiting 15 headache days a month and 10 are migrainous for years.  Had a long discussion today about possibilities including another oral preventatives, Botox for migraines, CGRP medications.  She is going to try CGRP medications gave her some samples today.  HPI:  Lisa Weaver is a 39 y.o. female here as a referral from Dr. No ref. provider found for migraines. She has been on Topamax for years. Started at the age of 39. Migraines are sharp and shooting, starts on one side of the head but can be either. It radiates through the eye to the back of the head. She gets neck pain, eye blurriness. Pounding and throbbing, light and sound sensitivity. Sun glasses help. Light makes it worse. Needs a quiet room. No significant nausea or vomiting maybe rare nausea. She has frequent mild headaches. No medication overuse. 20 headache days a month, she will have 2-3 severe migraine days a month. Can last 24 hours or longer without  treatment. 7-8 moderate-mild migraine days, the rest are mild low-level headache. She had tubes removed no risk of pregnancy. She used to be on 300mg  and that worked better. No signs or symptoms of sleep apnea. She has done food diary and everything, no food triggers, she watches her sleep. The night before her period she will get a migraine. She has allergy induced infrequent asthma.  No known history of migraines.  Meds tried: Atenolol, Topiramate  Reviewed notes, labs and imaging from outside physicians, which showed: Reviewed notes.  Patient has been on topiramate for many years except when she was pregnant, baby was born in March 2018.  In pregnancy she would use Tylenol a few times a week no medication overuse.  She stopped taking Topamax during pregnancy.  She did well during pregnancy however a week after delivery she started getting more intense headaches.  Miller to those headache she has had throughout the years.  At her last visit in May 2018 she stated 10 severe headache days, 8 moderate and 5 mild days a month.  The headache throbs with associated light and sound sensitivity worsened with movement.  She is tried ibuprofen in the past.  In the past she has been on Topamax up to 300 mg a day which was of benefit.  She tried Imitrex and was transiently making her dizzy.  She does have an allergy to sulfa.  Also allergy to Cefzil and Axert.  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  No known history of migraines.  Diagnosed with migraine and episodic tension type headaches.  He started her on Topamax at this last appointment.   Tried: Axert, Imitrex Bmp in the past unremarkable    REVIEW OF SYSTEMS: Out of a complete 14 system review of symptoms, the patient complains only of the following symptoms, headaches and all other reviewed systems are negative.   ALLERGIES: Allergies  Allergen Reactions  . Atenolol     dizziness  . Cefprozil Hives  .  Sulfa Antibiotics Hives     HOME MEDICATIONS: Outpatient Medications Prior to Visit  Medication Sig Dispense Refill  . albuterol (PROVENTIL HFA;VENTOLIN HFA) 108 (90 BASE) MCG/ACT inhaler Inhale 2 puffs into the lungs every 6 (six) hours as needed for wheezing or shortness of breath.     Marland Kitchen. ibuprofen (ADVIL,MOTRIN) 600 MG tablet Take 600 mg by mouth every 6 (six) hours as needed.    . rizatriptan (MAXALT) 10 MG tablet TAKE 1 TABLET BY MOUTH AS NEEDED FOR MIGRAINE. MAY REPEAT IN 2 HRS IF NEEDED. MAX TWICE IN ONE DAY. 4 tablet 29  . tiZANidine (ZANAFLEX) 4 MG tablet TAKE 1 TABLET (4 MG TOTAL) BY MOUTH EVERY 6 (SIX) HOURS AS NEEDED FOR MUSCLE SPASMS. 90 tablet 3  . topiramate (TOPAMAX) 200 MG tablet Take 1 tablet (200 mg total) by mouth daily. 30 tablet 2  . botulinum toxin Type A (BOTOX) 100 units SOLR injection Inject 155 units into the muscles of the head and neck. To be injected by the provider. Discard remainder. 200 Units 3   No facility-administered medications prior to visit.     PAST MEDICAL HISTORY: Past Medical History:  Diagnosis Date  . Asthma   . Blood dyscrasia    MTHFR- genetic clotting issue  . Infection    urinary tract infection  . MTHFR mutation   . Spinal headache      PAST SURGICAL HISTORY: Past Surgical History:  Procedure Laterality Date  . BILATERAL SALPINGECTOMY Bilateral 2018  . CESAREAN SECTION    . CESAREAN SECTION N/A 01/22/2017   Procedure: CESAREAN SECTION;  Surgeon: Silverio LaySandra Rivard, MD;  [facility];  Service: Obstetrics;  Laterality: N/A;  . CHOLECYSTECTOMY    . TONSILLECTOMY AND ADENOIDECTOMY    . WISDOM TOOTH EXTRACTION       FAMILY HISTORY: Family History  Problem Relation Age of Onset  . Hypertrophic cardiomyopathy Brother   . Heart disease Brother        Hypertrophic cadiomyopathy  . Cancer Mother 456       Ovarian  . Other Neg Hx      SOCIAL HISTORY: Social History   Socioeconomic History  . Marital status:  Single    Spouse name: Not on file  . Number of children: 2  . Years of education: Not on file  . Highest education level: Some college, no degree  Occupational History  . Not on file  Tobacco Use  . Smoking status: Never Smoker  . Smokeless tobacco: Never Used  Vaping Use  . Vaping Use: Never used  Substance and Sexual Activity  . Alcohol use: No    Comment: socially  . Drug use: No  . Sexual activity: Yes    Birth control/protection: Surgical  Other Topics Concern  . Not on file  Social History Narrative   Lives at home with her parents and her children   Right handed   Caffeine: occasional  Social Determinants of Health   Financial Resource Strain:   . Difficulty of Paying Living Expenses: Not on file  Food Insecurity:   . Worried About Programme researcher, broadcasting/film/video in the Last Year: Not on file  . Ran Out of Food in the Last Year: Not on file  Transportation Needs:   . Lack of Transportation (Medical): Not on file  . Lack of Transportation (Non-Medical): Not on file  Physical Activity:   . Days of Exercise per Week: Not on file  . Minutes of Exercise per Session: Not on file  Stress:   . Feeling of Stress : Not on file  Social Connections:   . Frequency of Communication with Friends and Family: Not on file  . Frequency of Social Gatherings with Friends and Family: Not on file  . Attends Religious Services: Not on file  . Active Member of Clubs or Organizations: Not on file  . Attends Banker Meetings: Not on file  . Marital Status: Not on file  Intimate Partner Violence:   . Fear of Current or Ex-Partner: Not on file  . Emotionally Abused: Not on file  . Physically Abused: Not on file  . Sexually Abused: Not on file      PHYSICAL EXAM  Vitals:   09/04/20 0840  BP: 115/80  Pulse: 81  Weight: 200 lb (90.7 kg)  Height: 5\' 6"  (1.676 m)   Body mass index is 32.28 kg/m.   Generalized: Well developed, in no acute distress   Cardiology: Normal  rate and rhythm, no murmur auscultated Respiratory: Clear to auscultation bilaterally  Neurological examination  Mentation: Alert oriented to time, place, history taking. Follows all commands speech and language fluent Cranial nerve II-XII: Pupils were equal round reactive to light. Extraocular movements were full, visual field were full on confrontational test. Facial sensation and strength were normal. Uvula tongue midline. Head turning and shoulder shrug  were normal and symmetric. Motor: The motor testing reveals 5 over 5 strength of all 4 extremities. Good symmetric motor tone is noted throughout.  Sensory: Sensory testing is intact to soft touch on all 4 extremities. No evidence of extinction is noted.  Coordination: Cerebellar testing reveals good finger-nose-finger and heel-to-shin bilaterally.  Gait and station: Gait is normal.      DIAGNOSTIC DATA (LABS, IMAGING, TESTING) - I reviewed patient records, labs, notes, testing and imaging myself where available.  Lab Results  Component Value Date   WBC 11.1 (H) 01/23/2017   HGB 10.6 (L) 01/23/2017   HCT 31.0 (L) 01/23/2017   MCV 84.9 01/23/2017   PLT 192 01/23/2017      Component Value Date/Time   NA 142 07/29/2011 0540   K 3.3 (L) 07/29/2011 0540   CL 110 07/29/2011 0540   CO2 22 07/29/2011 0540   GLUCOSE 101 (H) 07/29/2011 0540   BUN 9 07/29/2011 0540   CREATININE 0.74 01/22/2017 1411   CALCIUM 9.2 07/29/2011 0540   PROT 5.5 (L) 07/26/2011 0337   ALBUMIN 2.6 (L) 07/26/2011 0337   AST 44 (H) 07/26/2011 0337   ALT 33 07/26/2011 0337   ALKPHOS 71 07/26/2011 0337   BILITOT 0.6 07/26/2011 0337   GFRNONAA >60 01/22/2017 1411   GFRAA >60 01/22/2017 1411   No results found for: CHOL, HDL, LDLCALC, LDLDIRECT, TRIG, CHOLHDL No results found for: 01/24/2017 No results found for: VITAMINB12 No results found for: TSH    ASSESSMENT AND PLAN  39 y.o. year old female  has a past  medical history of Asthma, Blood dyscrasia,  Infection, MTHFR mutation, and Spinal headache. here with   Chronic migraine without aura without status migrainosus, not intractable  Milianna reports that she continues to have 8-10 headache days per month with 2-3 of these headaches being migrainous.  We will continue topiramate 200 mg at bedtime and rizatriptan for abortive therapy as this seems to be effective.  We have discussed restarting Botox and will see if we can get this covered with her insurance.  If she does not qualify, we will plan to start Qulipta as she reports difficulty remembering monthly injections.  We have discussed the mechanism of action and potential side effects of Qulipta.  Patient was given educational material today in the office.  Healthy lifestyle habits encouraged.  She will follow-up with me in 3 to 6 months, sooner if needed.  She verbalizes understanding and agreement with this plan.  Shawnie Dapper, FNP-C [facility]  Made any corrections needed, and agree with history, physical, neuro exam,assessment and plan as stated.     Naomie Dean, MD [facility]

## 2020-09-04 NOTE — Patient Instructions (Signed)
Below is our plan:  We will continue topiramate 200mg  daily and rizatriptan as needed. We will attempt to get Botox covered. If not we will start CGRP oral agent called Qulipta.   Please make sure you are staying well hydrated. I recommend 50-60 ounces daily. Well balanced diet and regular exercise encouraged.    Please continue follow up with care team as directed.   Follow up with me in 3-6 months   You may receive a survey regarding today's visit. I encourage you to leave honest feed back as I do use this information to improve patient care. Thank you for seeing me today!       Migraine Headache A migraine headache is a very strong throbbing pain on one side or both sides of your head. This type of headache can also cause other symptoms. It can last from 4 hours to 3 days. Talk with your doctor about what things may bring on (trigger) this condition. What are the causes? The exact cause of this condition is not known. This condition may be triggered or caused by:  Drinking alcohol.  Smoking.  Taking medicines, such as: ? Medicine used to treat chest pain (nitroglycerin). ? Birth control pills. ? Estrogen. ? Some blood pressure medicines.  Eating or drinking certain products.  Doing physical activity. Other things that may trigger a migraine headache include:  Having a menstrual period.  Pregnancy.  Hunger.  Stress.  Not getting enough sleep or getting too much sleep.  Weather changes.  Tiredness (fatigue). What increases the risk?  Being 26-106 years old.  Being female.  Having a family history of migraine headaches.  Being Caucasian.  Having depression or anxiety.  Being very overweight. What are the signs or symptoms?  A throbbing pain. This pain may: ? Happen in any area of the head, such as on one side or both sides. ? Make it hard to do daily activities. ? Get worse with physical activity. ? Get worse around bright lights or loud  noises.  Other symptoms may include: ? Feeling sick to your stomach (nauseous). ? Vomiting. ? Dizziness. ? Being sensitive to bright lights, loud noises, or smells.  Before you get a migraine headache, you may get warning signs (an aura). An aura may include: ? Seeing flashing lights or having blind spots. ? Seeing bright spots, halos, or zigzag lines. ? Having tunnel vision or blurred vision. ? Having numbness or a tingling feeling. ? Having trouble talking. ? Having weak muscles.  Some people have symptoms after a migraine headache (postdromal phase), such as: ? Tiredness. ? Trouble thinking (concentrating). How is this treated?  Taking medicines that: ? Relieve pain. ? Relieve the feeling of being sick to your stomach. ? Prevent migraine headaches.  Treatment may also include: ? Having acupuncture. ? Avoiding foods that bring on migraine headaches. ? Learning ways to control your body functions (biofeedback). ? Therapy to help you know and deal with negative thoughts (cognitive behavioral therapy). Follow these instructions at home: Medicines  Take over-the-counter and prescription medicines only as told by your doctor.  Ask your doctor if the medicine prescribed to you: ? Requires you to avoid driving or using heavy machinery. ? Can cause trouble pooping (constipation). You may need to take these steps to prevent or treat trouble pooping:  Drink enough fluid to keep your pee (urine) pale yellow.  Take over-the-counter or prescription medicines.  Eat foods that are high in fiber. These include beans, whole grains, and  fresh fruits and vegetables.  Limit foods that are high in fat and sugar. These include fried or sweet foods. Lifestyle  Do not drink alcohol.  Do not use any products that contain nicotine or tobacco, such as cigarettes, e-cigarettes, and chewing tobacco. If you need help quitting, ask your doctor.  Get at least 8 hours of sleep every  night.  Limit and deal with stress. General instructions      Keep a journal to find out what may bring on your migraine headaches. For example, write down: ? What you eat and drink. ? How much sleep you get. ? Any change in what you eat or drink. ? Any change in your medicines.  If you have a migraine headache: ? Avoid things that make your symptoms worse, such as bright lights. ? It may help to lie down in a dark, quiet room. ? Do not drive or use heavy machinery. ? Ask your doctor what activities are safe for you.  Keep all follow-up visits as told by your doctor. This is important. Contact a doctor if:  You get a migraine headache that is different or worse than others you have had.  You have more than 15 headache days in one month. Get help right away if:  Your migraine headache gets very bad.  Your migraine headache lasts longer than 72 hours.  You have a fever.  You have a stiff neck.  You have trouble seeing.  Your muscles feel weak or like you cannot control them.  You start to lose your balance a lot.  You start to have trouble walking.  You pass out (faint).  You have a seizure. Summary  A migraine headache is a very strong throbbing pain on one side or both sides of your head. These headaches can also cause other symptoms.  This condition may be treated with medicines and changes to your lifestyle.  Keep a journal to find out what may bring on your migraine headaches.  Contact a doctor if you get a migraine headache that is different or worse than others you have had.  Contact your doctor if you have more than 15 headache days in a month. This information is not intended to replace advice given to you by your health care provider. Make sure you discuss any questions you have with your health care provider. Document Revised: 02/10/2019 Document Reviewed: 12/01/2018 Elsevier Patient Education  2020 [employer].

## 2020-09-17 ENCOUNTER — Telehealth: Payer: Self-pay | Admitting: Family Medicine

## 2020-09-17 NOTE — Telephone Encounter (Signed)
I called patient's insurance [phone]) and spoke with Caitlyn to see if PA will be required for [id number] and [id number] for G43.709. Caitlyn states no PA is required. Reference [phone]. Patient's PBM is [employer]. I submitted PA request for Botox via CoverMyMeds. Waiting for response. FYI

## 2020-09-17 NOTE — Telephone Encounter (Signed)
TY

## 2020-09-17 NOTE — Telephone Encounter (Signed)
From: Shawnie Dapper, NP  Sent: 09/04/2020  9:36 AM EDT  To: Brennan Bailey   Can you let me know if insurance will cover Botox? Does it have to be ordered first? Patient having 8-10 headache days with 2-3 migraines. On Botox x 1 procedure in 03/2020. Wants to restart.

## 2020-09-23 NOTE — Telephone Encounter (Signed)
Received PA approval from [facility]. PA #[id number] (08/18/2020- 09/20/2021)

## 2020-09-23 NOTE — Telephone Encounter (Signed)
Faxed enrollment form to [facility].

## 2021-03-04 NOTE — Progress Notes (Deleted)
No chief complaint on file.    HISTORY OF PRESENT ILLNESS: 03/04/21 ALL:  She returns for migraine follow up.    09/04/2020 ALL: Lisa Weaver is a 40 y.o. female here today for follow up for migraines. She was last seen for her 1st Botox in 03/2019. She has continues topiramate 200mg  daily and rizatriptan PRN. She is having about 8-10 headache days per month with 2-3 being migrainous. She is not sure that she can remember to take monthly injections. She was given Emgality samples in the past but could not remember to take them monthly. Had dizziness and flu like symptoms with atenolol. Tubal in 2018.    HISTORY (copied from Dr 2019 note on 07/11/2018)  intervla history 07/11/2018: Patient is here for follow-up for migraines.  She is been on Topamax for years and she is tried atenolol and multiple other medications.  Migraines are unilateral, pulsating, throbbing, with light and sound sensitivity, nausea and vomiting 15 headache days a month and 10 are migrainous for years.  Had a long discussion today about possibilities including another oral preventatives, Botox for migraines, CGRP medications.  She is going to try CGRP medications gave her some samples today.  HPI:  Lisa Weaver is a 40 y.o. female here as a referral from Dr. No ref. provider found for migraines. She has been on Topamax for years. Started at the age of 65. Migraines are sharp and shooting, starts on one side of the head but can be either. It radiates through the eye to the back of the head. She gets neck pain, eye blurriness. Pounding and throbbing, light and sound sensitivity. Sun glasses help. Light makes it worse. Needs a quiet room. No significant nausea or vomiting maybe rare nausea. She has frequent mild headaches. No medication overuse. 20 headache days a month, she will have 2-3 severe migraine days a month. Can last 24 hours or longer without treatment. 7-8 moderate-mild migraine days, the rest are mild low-level  headache. She had tubes removed no risk of pregnancy. She used to be on 300mg  and that worked better. No signs or symptoms of sleep apnea. She has done food diary and everything, no food triggers, she watches her sleep. The night before her period she will get a migraine. She has allergy induced infrequent asthma.  No known history of migraines.  Meds tried: Atenolol, Topiramate  Reviewed notes, labs and imaging from outside physicians, which showed: Reviewed notes.  Patient has been on topiramate for many years except when she was pregnant, baby was born in March 2018.  In pregnancy she would use Tylenol a few times a week no medication overuse.  She stopped taking Topamax during pregnancy.  She did well during pregnancy however a week after delivery she started getting more intense headaches.  Miller to those headache she has had throughout the years.  At her last visit in May 2018 she stated 10 severe headache days, 8 moderate and 5 mild days a month.  The headache throbs with associated light and sound sensitivity worsened with movement.  She is tried ibuprofen in the past.  In the past she has been on Topamax up to 300 mg a day which was of benefit.  She tried Imitrex and was transiently making her dizzy.  She does have an allergy to sulfa.  Also allergy to Cefzil and Axert.  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  No known history of migraines.  Diagnosed with migraine and episodic  tension type headaches.  He started her on Topamax at this last appointment.   Tried: Axert, Imitrex Bmp in the past unremarkable    REVIEW OF SYSTEMS: Out of a complete 14 system review of symptoms, the patient complains only of the following symptoms, headaches and all other reviewed systems are negative.   ALLERGIES: Allergies  Allergen Reactions  . Atenolol     dizziness  . Cefprozil Hives  . Sulfa Antibiotics Hives     HOME MEDICATIONS: Outpatient  Medications Prior to Visit  Medication Sig Dispense Refill  . albuterol (PROVENTIL HFA;VENTOLIN HFA) 108 (90 BASE) MCG/ACT inhaler Inhale 2 puffs into the lungs every 6 (six) hours as needed for wheezing or shortness of breath.     Marland Kitchen ibuprofen (ADVIL,MOTRIN) 600 MG tablet Take 600 mg by mouth every 6 (six) hours as needed.    . rizatriptan (MAXALT) 10 MG tablet Take 1 tablet (10 mg total) by mouth 2 (two) times daily as needed for migraine. May repeat in 2 hours if needed 9 tablet 11  . tiZANidine (ZANAFLEX) 4 MG tablet TAKE 1 TABLET (4 MG TOTAL) BY MOUTH EVERY 6 (SIX) HOURS AS NEEDED FOR MUSCLE SPASMS. 90 tablet 3  . topiramate (TOPAMAX) 200 MG tablet Take 1 tablet (200 mg total) by mouth daily. 90 tablet 3   No facility-administered medications prior to visit.     PAST MEDICAL HISTORY: Past Medical History:  Diagnosis Date  . Asthma   . Blood dyscrasia    MTHFR- genetic clotting issue  . Infection    urinary tract infection  . MTHFR mutation   . Spinal headache      PAST SURGICAL HISTORY: Past Surgical History:  Procedure Laterality Date  . BILATERAL SALPINGECTOMY Bilateral 2018  . CESAREAN SECTION    . CESAREAN SECTION N/A 01/22/2017   Procedure: CESAREAN SECTION;  Surgeon: Silverio Lay, MD;  Location: [facility] BIRTHING SU[facility];  Service: Obstetrics;  Laterality: N/A;  . CHOLECYSTECTOMY    . TONSILLECTOMY AND ADENOIDECTOMY    . WISDOM TOOTH EXTRACTION       FAMILY HISTORY: Family History  Problem Relation Age of Onset  . Hypertrophic cardiomyopathy Brother   . Heart disease Brother        Hypertrophic cadiomyopathy  . Cancer Mother 93       Ovarian  . Other Neg Hx      SOCIAL HISTORY: Social History   Socioeconomic History  . Marital status: Single    Spouse name: Not on file  . Number of children: 2  . Years of education: Not on file  . Highest education level: Some college, no degree  Occupational History  . Not on file  Tobacco Use  . Smoking status: Never  Smoker  . Smokeless tobacco: Never Used  Vaping Use  . Vaping Use: Never used  Substance and Sexual Activity  . Alcohol use: No    Comment: socially  . Drug use: No  . Sexual activity: Yes    Birth control/protection: Surgical  Other Topics Concern  . Not on file  Social History Narrative   Lives at home with her parents and her children   Right handed   Caffeine: occasional    Social Determinants of Health   Financial Resource Strain: Not on file  Food Insecurity: Not on file  Transportation Needs: Not on file  Physical Activity: Not on file  Stress: Not on file  Social Connections: Not on file  Intimate Partner Violence: Not on file  PHYSICAL EXAM  There were no vitals filed for this visit. There is no height or weight on file to calculate BMI.   Generalized: Well developed, in no acute distress   Cardiology: Normal rate and rhythm, no murmur auscultated Respiratory: Clear to auscultation bilaterally  Neurological examination  Mentation: Alert oriented to time, place, history taking. Follows all commands speech and language fluent Cranial nerve II-XII: Pupils were equal round reactive to light. Extraocular movements were full, visual field were full on confrontational test. Facial sensation and strength were normal. Uvula tongue midline. Head turning and shoulder shrug  were normal and symmetric. Motor: The motor testing reveals 5 over 5 strength of all 4 extremities. Good symmetric motor tone is noted throughout.  Sensory: Sensory testing is intact to soft touch on all 4 extremities. No evidence of extinction is noted.  Coordination: Cerebellar testing reveals good finger-nose-finger and heel-to-shin bilaterally.  Gait and station: Gait is normal.      DIAGNOSTIC DATA (LABS, IMAGING, TESTING) - I reviewed patient records, labs, notes, testing and imaging myself where available.  Lab Results  Component Value Date   WBC 11.1 (H) 01/23/2017   HGB 10.6 (L)  01/23/2017   HCT 31.0 (L) 01/23/2017   MCV 84.9 01/23/2017   PLT 192 01/23/2017      Component Value Date/Time   NA 142 07/29/2011 0540   K 3.3 (L) 07/29/2011 0540   CL 110 07/29/2011 0540   CO2 22 07/29/2011 0540   GLUCOSE 101 (H) 07/29/2011 0540   BUN 9 07/29/2011 0540   CREATININE 0.74 01/22/2017 1411   CALCIUM 9.2 07/29/2011 0540   PROT 5.5 (L) 07/26/2011 0337   ALBUMIN 2.6 (L) 07/26/2011 0337   AST 44 (H) 07/26/2011 0337   ALT 33 07/26/2011 0337   ALKPHOS 71 07/26/2011 0337   BILITOT 0.6 07/26/2011 0337   GFRNONAA >60 01/22/2017 1411   GFRAA >60 01/22/2017 1411   No results found for: CHOL, HDL, LDLCALC, LDLDIRECT, TRIG, CHOLHDL No results found for: AYTK1S No results found for: VITAMINB12 No results found for: TSH    ASSESSMENT AND PLAN  40 y.o. year old female  has a past medical history of Asthma, Blood dyscrasia, Infection, MTHFR mutation, and Spinal headache. here with   No diagnosis found.  [place] reports that she continues to have 8-10 headache days per month with 2-3 of these headaches being migrainous.  We will continue topiramate 200 mg at bedtime and rizatriptan for abortive therapy as this seems to be effective.  We have discussed restarting Botox and will see if we can get this covered with her insurance.  If she does not qualify, we will plan to start Qulipta as she reports difficulty remembering monthly injections.  We have discussed the mechanism of action and potential side effects of Qulipta.  Patient was given educational material today in the office.  Healthy lifestyle habits encouraged.  She will follow-up with me in 3 to 6 months, sooner if needed.  She verbalizes understanding and agreement with this plan.  Shawnie Dapper, FNP-C [facility]

## 2021-03-04 NOTE — Patient Instructions (Incomplete)
Below is our plan:  We will ***  Please make sure you are staying well hydrated. I recommend 50-60 ounces daily. Well balanced diet and regular exercise encouraged. Consistent sleep schedule with 6-8 hours recommended.   Please continue follow up with care team as directed.   Follow up with *** in ***  You may receive a survey regarding today's visit. I encourage you to leave honest feed back as I do use this information to improve patient care. Thank you for seeing me today!      Migraine Headache A migraine headache is a very strong throbbing pain on one side or both sides of your head. This type of headache can also cause other symptoms. It can last from 4 hours to 3 days. Talk with your doctor about what things may bring on (trigger) this condition. What are the causes? The exact cause of this condition is not known. This condition may be triggered or caused by:  Drinking alcohol.  Smoking.  Taking medicines, such as: ? Medicine used to treat chest pain (nitroglycerin). ? Birth control pills. ? Estrogen. ? Some blood pressure medicines.  Eating or drinking certain products.  Doing physical activity. Other things that may trigger a migraine headache include:  Having a menstrual period.  Pregnancy.  Hunger.  Stress.  Not getting enough sleep or getting too much sleep.  Weather changes.  Tiredness (fatigue). What increases the risk?  Being 25-55 years old.  Being female.  Having a family history of migraine headaches.  Being Caucasian.  Having depression or anxiety.  Being very overweight. What are the signs or symptoms?  A throbbing pain. This pain may: ? Happen in any area of the head, such as on one side or both sides. ? Make it hard to do daily activities. ? Get worse with physical activity. ? Get worse around bright lights or loud noises.  Other symptoms may include: ? Feeling sick to your stomach  (nauseous). ? Vomiting. ? Dizziness. ? Being sensitive to bright lights, loud noises, or smells.  Before you get a migraine headache, you may get warning signs (an aura). An aura may include: ? Seeing flashing lights or having blind spots. ? Seeing bright spots, halos, or zigzag lines. ? Having tunnel vision or blurred vision. ? Having numbness or a tingling feeling. ? Having trouble talking. ? Having weak muscles.  Some people have symptoms after a migraine headache (postdromal phase), such as: ? Tiredness. ? Trouble thinking (concentrating). How is this treated?  Taking medicines that: ? Relieve pain. ? Relieve the feeling of being sick to your stomach. ? Prevent migraine headaches.  Treatment may also include: ? Having acupuncture. ? Avoiding foods that bring on migraine headaches. ? Learning ways to control your body functions (biofeedback). ? Therapy to help you know and deal with negative thoughts (cognitive behavioral therapy). Follow these instructions at home: Medicines  Take over-the-counter and prescription medicines only as told by your doctor.  Ask your doctor if the medicine prescribed to you: ? Requires you to avoid driving or using heavy machinery. ? Can cause trouble pooping (constipation). You may need to take these steps to prevent or treat trouble pooping:  Drink enough fluid to keep your pee (urine) pale yellow.  Take over-the-counter or prescription medicines.  Eat foods that are high in fiber. These include beans, whole grains, and fresh fruits and vegetables.  Limit foods that are high in fat and sugar. These include fried or sweet foods. Lifestyle    Do not drink alcohol.  Do not use any products that contain nicotine or tobacco, such as cigarettes, e-cigarettes, and chewing tobacco. If you need help quitting, ask your doctor.  Get at least 8 hours of sleep every night.  Limit and deal with stress. General instructions  Keep a journal to  find out what may bring on your migraine headaches. For example, write down: ? What you eat and drink. ? How much sleep you get. ? Any change in what you eat or drink. ? Any change in your medicines.  If you have a migraine headache: ? Avoid things that make your symptoms worse, such as bright lights. ? It may help to lie down in a dark, quiet room. ? Do not drive or use heavy machinery. ? Ask your doctor what activities are safe for you.  Keep all follow-up visits as told by your doctor. This is important.      Contact a doctor if:  You get a migraine headache that is different or worse than others you have had.  You have more than 15 headache days in one month. Get help right away if:  Your migraine headache gets very bad.  Your migraine headache lasts longer than 72 hours.  You have a fever.  You have a stiff neck.  You have trouble seeing.  Your muscles feel weak or like you cannot control them.  You start to lose your balance a lot.  You start to have trouble walking.  You pass out (faint).  You have a seizure. Summary  A migraine headache is a very strong throbbing pain on one side or both sides of your head. These headaches can also cause other symptoms.  This condition may be treated with medicines and changes to your lifestyle.  Keep a journal to find out what may bring on your migraine headaches.  Contact a doctor if you get a migraine headache that is different or worse than others you have had.  Contact your doctor if you have more than 15 headache days in a month. This information is not intended to replace advice given to you by your health care provider. Make sure you discuss any questions you have with your health care provider. Document Revised: 02/10/2019 Document Reviewed: 12/01/2018 Elsevier Patient Education  2021 [employer]  

## 2021-03-05 ENCOUNTER — Ambulatory Visit: Payer: PRIVATE HEALTH INSURANCE | Admitting: Family Medicine

## 2022-05-26 DIAGNOSIS — T148XXA Other injury of unspecified body region, initial encounter: Secondary | ICD-10-CM | POA: Diagnosis not present

## 2022-05-26 DIAGNOSIS — L0390 Cellulitis, unspecified: Secondary | ICD-10-CM | POA: Diagnosis not present

## 2022-06-11 DIAGNOSIS — R051 Acute cough: Secondary | ICD-10-CM | POA: Diagnosis not present

## 2022-06-16 DIAGNOSIS — R059 Cough, unspecified: Secondary | ICD-10-CM | POA: Diagnosis not present

## 2022-06-16 DIAGNOSIS — J209 Acute bronchitis, unspecified: Secondary | ICD-10-CM | POA: Diagnosis not present

## 2022-06-22 DIAGNOSIS — R051 Acute cough: Secondary | ICD-10-CM | POA: Diagnosis not present

## 2022-06-22 DIAGNOSIS — J22 Unspecified acute lower respiratory infection: Secondary | ICD-10-CM | POA: Diagnosis not present

## 2022-08-21 DIAGNOSIS — Z23 Encounter for immunization: Secondary | ICD-10-CM | POA: Diagnosis not present

## 2022-08-21 DIAGNOSIS — G43909 Migraine, unspecified, not intractable, without status migrainosus: Secondary | ICD-10-CM | POA: Diagnosis not present

## 2022-08-21 DIAGNOSIS — Z0000 Encounter for general adult medical examination without abnormal findings: Secondary | ICD-10-CM | POA: Diagnosis not present

## 2022-08-21 DIAGNOSIS — Z1283 Encounter for screening for malignant neoplasm of skin: Secondary | ICD-10-CM | POA: Diagnosis not present

## 2022-08-21 LAB — HM HEPATITIS C SCREENING LAB: HM Hepatitis Screen: NEGATIVE

## 2023-01-08 DIAGNOSIS — H6992 Unspecified Eustachian tube disorder, left ear: Secondary | ICD-10-CM | POA: Diagnosis not present

## 2023-01-08 DIAGNOSIS — H61892 Other specified disorders of left external ear: Secondary | ICD-10-CM | POA: Diagnosis not present

## 2023-01-08 DIAGNOSIS — H6090 Unspecified otitis externa, unspecified ear: Secondary | ICD-10-CM | POA: Diagnosis not present

## 2023-01-19 DIAGNOSIS — K5289 Other specified noninfective gastroenteritis and colitis: Secondary | ICD-10-CM | POA: Diagnosis not present

## 2023-10-16 LAB — HM MAMMOGRAPHY: HM Mammogram: NORMAL (ref 0–4)

## 2023-10-18 DIAGNOSIS — E559 Vitamin D deficiency, unspecified: Secondary | ICD-10-CM | POA: Diagnosis not present

## 2023-10-18 DIAGNOSIS — R87810 Cervical high risk human papillomavirus (HPV) DNA test positive: Secondary | ICD-10-CM | POA: Diagnosis not present

## 2023-10-18 DIAGNOSIS — R635 Abnormal weight gain: Secondary | ICD-10-CM | POA: Diagnosis not present

## 2023-10-18 DIAGNOSIS — Z01419 Encounter for gynecological examination (general) (routine) without abnormal findings: Secondary | ICD-10-CM | POA: Diagnosis not present

## 2023-10-18 DIAGNOSIS — N926 Irregular menstruation, unspecified: Secondary | ICD-10-CM | POA: Diagnosis not present

## 2023-10-18 DIAGNOSIS — Z1231 Encounter for screening mammogram for malignant neoplasm of breast: Secondary | ICD-10-CM | POA: Diagnosis not present

## 2023-10-18 DIAGNOSIS — Z1330 Encounter for screening examination for mental health and behavioral disorders, unspecified: Secondary | ICD-10-CM | POA: Diagnosis not present

## 2023-10-18 LAB — HM PAP SMEAR: HM Pap smear: NEGATIVE

## 2023-10-19 DIAGNOSIS — R635 Abnormal weight gain: Secondary | ICD-10-CM | POA: Diagnosis not present

## 2023-10-19 DIAGNOSIS — Z139 Encounter for screening, unspecified: Secondary | ICD-10-CM | POA: Diagnosis not present

## 2023-10-19 DIAGNOSIS — N926 Irregular menstruation, unspecified: Secondary | ICD-10-CM | POA: Diagnosis not present

## 2023-10-19 DIAGNOSIS — Z6839 Body mass index (BMI) 39.0-39.9, adult: Secondary | ICD-10-CM | POA: Diagnosis not present

## 2023-10-19 DIAGNOSIS — E559 Vitamin D deficiency, unspecified: Secondary | ICD-10-CM | POA: Diagnosis not present

## 2023-10-21 DIAGNOSIS — F3281 Premenstrual dysphoric disorder: Secondary | ICD-10-CM | POA: Diagnosis not present

## 2023-10-21 DIAGNOSIS — E559 Vitamin D deficiency, unspecified: Secondary | ICD-10-CM | POA: Diagnosis not present

## 2023-10-21 DIAGNOSIS — R7303 Prediabetes: Secondary | ICD-10-CM | POA: Diagnosis not present

## 2024-01-14 ENCOUNTER — Encounter: Payer: Self-pay | Admitting: Urgent Care

## 2024-01-14 ENCOUNTER — Ambulatory Visit: Admitting: Urgent Care

## 2024-01-14 VITALS — BP 111/80 | HR 87 | Ht 67.0 in | Wt 242.8 lb

## 2024-01-14 DIAGNOSIS — G44209 Tension-type headache, unspecified, not intractable: Secondary | ICD-10-CM

## 2024-01-14 DIAGNOSIS — G43909 Migraine, unspecified, not intractable, without status migrainosus: Secondary | ICD-10-CM | POA: Diagnosis not present

## 2024-01-14 DIAGNOSIS — R197 Diarrhea, unspecified: Secondary | ICD-10-CM

## 2024-01-14 DIAGNOSIS — Z9049 Acquired absence of other specified parts of digestive tract: Secondary | ICD-10-CM

## 2024-01-14 DIAGNOSIS — R7989 Other specified abnormal findings of blood chemistry: Secondary | ICD-10-CM | POA: Insufficient documentation

## 2024-01-14 DIAGNOSIS — R4586 Emotional lability: Secondary | ICD-10-CM | POA: Insufficient documentation

## 2024-01-14 DIAGNOSIS — G43709 Chronic migraine without aura, not intractable, without status migrainosus: Secondary | ICD-10-CM

## 2024-01-14 DIAGNOSIS — E559 Vitamin D deficiency, unspecified: Secondary | ICD-10-CM | POA: Insufficient documentation

## 2024-01-14 DIAGNOSIS — R635 Abnormal weight gain: Secondary | ICD-10-CM | POA: Insufficient documentation

## 2024-01-14 DIAGNOSIS — R7303 Prediabetes: Secondary | ICD-10-CM | POA: Insufficient documentation

## 2024-01-14 MED ORDER — RIZATRIPTAN BENZOATE 10 MG PO TABS: 10.0000 mg | ORAL_TABLET | Freq: Two times a day (BID) | ORAL | 11 refills | Status: AC | PRN

## 2024-01-14 MED ORDER — TOPIRAMATE 200 MG PO TABS: 200.0000 mg | ORAL_TABLET | Freq: Every day | ORAL | 3 refills | Status: AC

## 2024-01-14 MED ORDER — TIZANIDINE HCL 4 MG PO TABS: 4.0000 mg | ORAL_TABLET | Freq: Four times a day (QID) | ORAL | 3 refills | Status: AC | PRN

## 2024-01-14 MED ORDER — VENLAFAXINE HCL ER 75 MG PO CP24: 75.0000 mg | ORAL_CAPSULE | Freq: Every day | ORAL | 0 refills | Status: DC

## 2024-01-14 MED ORDER — CHOLESTYRAMINE LIGHT 4 G PO PACK: 4.0000 g | PACK | Freq: Two times a day (BID) | ORAL | 2 refills | Status: DC

## 2024-01-14 NOTE — Patient Instructions (Addendum)
 I have refilled your previous migraine medications.  In addition, please start taking venlafaxine daily. This will help with mood changes as well as help prevent migraines.  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 Please apply a warm moist compress, such as a microwavable heating pack, to your neck several times daily. After each warm compress, apply the technique that we discussed today of ischemic release. This is a prolonged, deep pressure into the knot of the muscle to release the tension.  Epsom salt baths help with muscle tension.  Take the muscle relaxer three times daily as needed. Keep in mind it may make you feel tired or drowsy, so do not operate machinery or drive a car until you know how it affects you.  If your symptoms persist, you would be a candidate for trigger point injection or dry needling.  Try to stay hydrated with WATER as dehydration and caffeine intake can worsen this condition.   Try the cholestyramine one packet daily. May increase all the way up to 4 packets daily depending on your response.  Please schedule an annual PE with fasting labs in about 5 weeks.

## 2024-01-14 NOTE — Progress Notes (Signed)
 New Patient Office Visit  Subjective:  Patient ID: Lisa Weaver, female    DOB: 01/18/1981  Age: 43 y.o. MRN: [id number]  CC:  Chief Complaint  Patient presents with   Establish Care    New pt est care. Pt will need refills on migraine. Pt sees [facility] for her for paps.    HPI Lisa Weaver presents to establish care.  Discussed the use of AI scribe software for clinical note transcription with the patient, who gave verbal consent to proceed.  History of Present Illness   Lisa Weaver is a 43 year old female with migraines who presents for management of her condition.  She has experienced migraines since age 68 and is currently on topiramate for prevention, which has significantly reduced her migraine frequency to approximately two episodes per month. During these episodes, she takes rizatriptan and occasionally experiences nausea and dizziness, which she manages by increasing fluid intake. She has tried various medications in the past, but topiramate has been the most effective. Weather and menstrual periods are identified as triggers. A food journal did not reveal dietary triggers, but it led to the discovery of gallbladder issues, resulting in its removal in 2012.  She experiences muscle tension associated with migraines, particularly affecting her neck and eye area. She uses Zanaflex as needed for muscle relaxation, primarily at night due to its sedative effects. She has not tried other methods like moist heat or massage for muscle tension relief.  Post-cholecystectomy, she experiences frequent loose stools and takes Imodium to manage this. She reports needing to take Imodium before events like lunch meetings to prevent urgent bowel movements. She does not experience abdominal cramping but notes that sometimes after eating, she needs to use the bathroom immediately. She has not tied this to any specific foods.  She reports increased irritability over the past year, which she initially  discussed with her gynecologist. Hormonal testing did not indicate perimenopause. She describes feeling tense and taking frustrations out on her family, despite trying various stress-relief techniques. The irritability is not limited to premenstrual periods but occurs throughout the month.  She has a history of weight gain, approximately 50-60 pounds over the past year, despite no changes in diet or lifestyle. Her thyroid function was tested and found to be normal. Her vitamin D levels have been consistently low, and recent labs indicated a slight increase in A1c to 5.7, suggesting prediabetes.           Outpatient Encounter Medications as of 01/14/2024  Medication Sig   albuterol (PROVENTIL HFA;VENTOLIN HFA) 108 (90 BASE) MCG/ACT inhaler Inhale 2 puffs into the lungs every 6 (six) hours as needed for wheezing or shortness of breath.    cholestyramine light (PREVALITE) 4 g packet Take 1 packet (4 g total) by mouth 2 (two) times daily.   ibuprofen (ADVIL,MOTRIN) 600 MG tablet Take 600 mg by mouth every 6 (six) hours as needed.   venlafaxine XR (EFFEXOR XR) 75 MG 24 hr capsule Take 1 capsule (75 mg total) by mouth daily with breakfast.   [DISCONTINUED] rizatriptan (MAXALT) 10 MG tablet Take 1 tablet (10 mg total) by mouth 2 (two) times daily as needed for migraine. May repeat in 2 hours if needed   [DISCONTINUED] tiZANidine (ZANAFLEX) 4 MG tablet TAKE 1 TABLET (4 MG TOTAL) BY MOUTH EVERY 6 (SIX) HOURS AS NEEDED FOR MUSCLE SPASMS.   [DISCONTINUED] topiramate (TOPAMAX) 200 MG tablet Take 1 tablet (200 mg total) by mouth daily.   rizatriptan (MAXALT) 10  MG tablet Take 1 tablet (10 mg total) by mouth 2 (two) times daily as needed for migraine. May repeat in 2 hours if needed   tiZANidine (ZANAFLEX) 4 MG tablet Take 1 tablet (4 mg total) by mouth every 6 (six) hours as needed for muscle spasms.   topiramate (TOPAMAX) 200 MG tablet Take 1 tablet (200 mg total) by mouth daily.   No facility-administered  encounter medications on file as of 01/14/2024.    Past Medical History:  Diagnosis Date   Allergy    Seasonal, pollen, grass, cats   Asthma    Blood dyscrasia    MTHFR- genetic clotting issue   Infection    urinary tract infection   MTHFR mutation    Spinal headache     Past Surgical History:  Procedure Laterality Date   BILATERAL SALPINGECTOMY Bilateral 2018   CESAREAN SECTION     CESAREAN SECTION N/A 01/22/2017   Procedure: CESAREAN SECTION;  Surgeon: Silverio Lay, MD;  Location: [facility];  Service: Obstetrics;  Laterality: N/A;   CHOLECYSTECTOMY     TONSILLECTOMY AND ADENOIDECTOMY     TUBAL LIGATION  01/22/2017   WISDOM TOOTH EXTRACTION      Family History  Problem Relation Age of Onset   Hypertrophic cardiomyopathy Brother    Heart disease Brother        Hypertrophic cadiomyopathy   ADD / ADHD Brother    Cancer Mother 69       Ovarian   Cancer Father    Diabetes Paternal Grandmother    Diabetes Paternal Uncle    Heart disease Brother    Other Neg Hx     Social History   Socioeconomic History   Marital status: Single    Spouse name: Not on file   Number of children: 2   Years of education: Not on file   Highest education level: Some college, no degree  Occupational History   Not on file  Tobacco Use   Smoking status: Never   Smokeless tobacco: Never  Vaping Use   Vaping status: Never Used  Substance and Sexual Activity   Alcohol use: No    Comment: socially   Drug use: No   Sexual activity: Yes    Birth control/protection: Surgical  Other Topics Concern   Not on file  Social History Narrative   Lives at home with her parents and her children   Right handed   Caffeine: occasional    Social Drivers of Corporate investment banker Strain: Low Risk  (01/11/2024)   Overall Financial Resource Strain (CARDIA)    Difficulty of Paying Living Expenses: Not very hard  Food Insecurity: No Food Insecurity (01/11/2024)   Hunger Vital Sign     Worried About Running Out of Food in the Last Year: Never true    Ran Out of Food in the Last Year: Never true  Transportation Needs: No Transportation Needs (01/11/2024)   PRAPARE - Administrator, Civil Service (Medical): No    Lack of Transportation (Non-Medical): No  Physical Activity: Insufficiently Active (01/11/2024)   Exercise Vital Sign    Days of Exercise per Week: 1 day    Minutes of Exercise per Session: 10 min  Stress: No Stress Concern Present (01/11/2024)   [employer] of Occupational Health - Occupational Stress Questionnaire    Feeling of Stress : Only a little  Social Connections: Socially Isolated (01/11/2024)   Social Connection and Isolation Panel [NHANES]  Frequency of Communication with Friends and Family: Twice a week    Frequency of Social Gatherings with Friends and Family: Never    Attends Religious Services: Never    Database administrator or Organizations: No    Attends Engineer, structural: Not on file    Marital Status: Living with partner  Intimate Partner Violence: Not on file    ROS: as noted in HPI  Objective:  BP 111/80   Pulse 87   Ht 5\' 7"  (1.702 m)   Wt 242 lb 12.8 oz (110.1 kg)   SpO2 97%   BMI 38.03 kg/m   Physical Exam Vitals and nursing note reviewed.  Constitutional:      General: She is not in acute distress.    Appearance: Normal appearance. She is obese. She is not ill-appearing, toxic-appearing or diaphoretic.  HENT:     Head: Normocephalic and atraumatic.     Right Ear: Tympanic membrane, ear canal and external ear normal. There is no impacted cerumen.     Left Ear: Tympanic membrane, ear canal and external ear normal. There is no impacted cerumen.     Nose: Nose normal.     Mouth/Throat:     Mouth: Mucous membranes are moist.     Pharynx: Oropharynx is clear. No oropharyngeal exudate or posterior oropharyngeal erythema.  Eyes:     General: No scleral icterus.       Right eye: No discharge.         Left eye: No discharge.     Extraocular Movements: Extraocular movements intact.     Pupils: Pupils are equal, round, and reactive to light.  Neck:     Thyroid: No thyromegaly or thyroid tenderness.   Cardiovascular:     Rate and Rhythm: Normal rate and regular rhythm.     Pulses: Normal pulses.     Heart sounds: No murmur heard. Pulmonary:     Effort: Pulmonary effort is normal. No respiratory distress.     Breath sounds: Normal breath sounds. No stridor. No wheezing or rhonchi.  Musculoskeletal:     Cervical back: Normal range of motion and neck supple. No rigidity or tenderness.     Right lower leg: No edema.     Left lower leg: No edema.  Lymphadenopathy:     Cervical: No cervical adenopathy.     Right cervical: No superficial cervical adenopathy.    Left cervical: No superficial cervical adenopathy.  Skin:    General: Skin is warm and dry.     Coloration: Skin is not jaundiced.     Findings: No bruising, erythema or rash.  Neurological:     General: No focal deficit present.     Mental Status: She is alert and oriented to person, place, and time.     Sensory: No sensory deficit.     Motor: No weakness.  Psychiatric:        Mood and Affect: Mood normal.        Behavior: Behavior normal.     Assessment & Plan:  Migraine without status migrainosus, not intractable, unspecified migraine type -     Rizatriptan Benzoate; Take 1 tablet (10 mg total) by mouth 2 (two) times daily as needed for migraine. May repeat in 2 hours if needed  Dispense: 9 tablet; Refill: 11 -     Topiramate; Take 1 tablet (200 mg total) by mouth daily.  Dispense: 90 tablet; Refill: 3 -     Venlafaxine HCl ER; Take  1 capsule (75 mg total) by mouth daily with breakfast.  Dispense: 90 capsule; Refill: 0  Muscle tension headache -     tiZANidine HCl; Take 1 tablet (4 mg total) by mouth every 6 (six) hours as needed for muscle spasms.  Dispense: 90 tablet; Refill: 3  Chronic migraine without aura  without status migrainosus, not intractable -     Rizatriptan Benzoate; Take 1 tablet (10 mg total) by mouth 2 (two) times daily as needed for migraine. May repeat in 2 hours if needed  Dispense: 9 tablet; Refill: 11 -     Topiramate; Take 1 tablet (200 mg total) by mouth daily.  Dispense: 90 tablet; Refill: 3 -     Venlafaxine HCl ER; Take 1 capsule (75 mg total) by mouth daily with breakfast.  Dispense: 90 capsule; Refill: 0  Diarrhea, unspecified type -     Cholestyramine Light; Take 1 packet (4 g total) by mouth 2 (two) times daily.  Dispense: 60 each; Refill: 2  History of cholecystectomy -     Cholestyramine Light; Take 1 packet (4 g total) by mouth 2 (two) times daily.  Dispense: 60 each; Refill: 2  Mood changes -     Venlafaxine HCl ER; Take 1 capsule (75 mg total) by mouth daily with breakfast.  Dispense: 90 capsule; Refill: 0  Elevated serum creatinine  Vitamin D deficiency  Prediabetes  Weight gain  Assessment and Plan    Migraine Chronic migraines managed with topiramate and rizatriptan. Frequency reduced to two per month. Venlafaxine proposed for additional prevention and mood stabilization. - Refill rizatriptan and topiramate prescriptions. - Continue Zanaflex as needed for muscle tension. - Consider moist heat packs or Epsom salt baths for muscle tension relief. - Start venlafaxine for additional migraine prevention and mood stabilization.  Irritability Increased irritability not linked to menstrual cycle. Stress and anxiety considered contributing factors. Venlafaxine proposed for mood stabilization. - Start venlafaxine, monitor for side effects such as difficulty achieving orgasm. - Schedule follow-up in one month to assess response to venlafaxine.  Post-cholecystectomy diarrhea Frequent loose stools managed with loperamide. Discussed cholestyramine to bulk stools without causing constipation. - Start cholestyramine powder, begin with one packet daily and adjust  up to four packets daily based on response.  General Health Maintenance Recent screenings normal. Labs showed prediabetes, vitamin D deficiency, slight creatinine elevation, and significant weight gain. Questionable thyroid nodule palpated, further testing planned. - Repeat labs including A1c, vitamin D, and thyroid function tests. - Consider thyroid ultrasound if lab abnormalities persist. - Encourage fasting labs for accurate results. - Request records from gynecologist for recent lab results.        Return in about 5 weeks (around 02/18/2024).   Maretta Bees, PA

## 2024-02-25 ENCOUNTER — Ambulatory Visit: Admitting: Urgent Care

## 2024-03-31 ENCOUNTER — Ambulatory Visit: Payer: Self-pay | Admitting: Urgent Care

## 2024-03-31 ENCOUNTER — Encounter: Payer: Self-pay | Admitting: Urgent Care

## 2024-03-31 ENCOUNTER — Ambulatory Visit (INDEPENDENT_AMBULATORY_CARE_PROVIDER_SITE_OTHER): Admitting: Urgent Care

## 2024-03-31 VITALS — BP 114/83 | HR 82 | Ht 67.0 in | Wt 244.4 lb

## 2024-03-31 DIAGNOSIS — R635 Abnormal weight gain: Secondary | ICD-10-CM | POA: Diagnosis not present

## 2024-03-31 DIAGNOSIS — Z0000 Encounter for general adult medical examination without abnormal findings: Secondary | ICD-10-CM | POA: Diagnosis not present

## 2024-03-31 DIAGNOSIS — R221 Localized swelling, mass and lump, neck: Secondary | ICD-10-CM

## 2024-03-31 DIAGNOSIS — R4586 Emotional lability: Secondary | ICD-10-CM

## 2024-03-31 LAB — LIPID PANEL
Cholesterol: 171 mg/dL (ref 0–200)
HDL: 54.3 mg/dL (ref >39.00)
LDL Cholesterol: 105 mg/dL — ABNORMAL HIGH (ref 0–99)
NonHDL: 116.4
Total CHOL/HDL Ratio: 3
Triglycerides: 56 mg/dL (ref 0.0–149.0)
VLDL: 11.2 mg/dL (ref 0.0–40.0)

## 2024-03-31 LAB — COMPREHENSIVE METABOLIC PANEL WITH GFR
ALT: 15 U/L (ref 0–35)
AST: 13 U/L (ref 0–37)
Albumin: 4.4 g/dL (ref 3.5–5.2)
Alkaline Phosphatase: 92 U/L (ref 39–117)
BUN: 18 mg/dL (ref 6–23)
CO2: 24 meq/L (ref 19–32)
Calcium: 9.6 mg/dL (ref 8.4–10.5)
Chloride: 108 meq/L (ref 96–112)
Creatinine, Ser: 0.99 mg/dL (ref 0.40–1.20)
GFR: 70.82 mL/min (ref >60.00)
Glucose, Bld: 94 mg/dL (ref 70–99)
Potassium: 4.4 meq/L (ref 3.5–5.1)
Sodium: 141 meq/L (ref 135–145)
Total Bilirubin: 0.4 mg/dL (ref 0.2–1.2)
Total Protein: 7.1 g/dL (ref 6.0–8.3)

## 2024-03-31 LAB — TSH: TSH: 1.7 u[IU]/mL (ref 0.35–5.50)

## 2024-03-31 LAB — CBC
HCT: 38.9 % (ref 36.0–46.0)
Hemoglobin: 12.8 g/dL (ref 12.0–15.0)
MCHC: 32.8 g/dL (ref 30.0–36.0)
MCV: 84.2 fl (ref 78.0–100.0)
Platelets: 322 10*3/uL (ref 150.0–400.0)
RBC: 4.62 Mil/uL (ref 3.87–5.11)
RDW: 14.4 % (ref 11.5–15.5)
WBC: 4.9 10*3/uL (ref 4.0–10.5)

## 2024-03-31 LAB — HEMOGLOBIN A1C: Hgb A1c MFr Bld: 5.5 % (ref 4.6–6.5)

## 2024-03-31 MED ORDER — BUPROPION HCL ER (XL) 150 MG PO TB24: 150.0000 mg | ORAL_TABLET | Freq: Every day | ORAL | 0 refills | Status: AC

## 2024-03-31 NOTE — Progress Notes (Signed)
 Annual Wellness Visit     Patient: Lisa Weaver, Female    DOB: 05-10-1981, 43 y.o.   MRN: [id number]  Subjective  Chief Complaint  Patient presents with   Annual Exam    Fasting CPE. Pt is no longer taking Effexor  due to side effects.    Lisa Weaver is a 43 y.o. female who presents today for her Annual Wellness Visit. She reports consuming a low dairy diet. The patient does not participate in regular exercise at present. She generally feels fairly well. She reports sleeping well. She does not have additional problems to discuss today.   HPI   Discussed the use of AI scribe software for clinical note transcription with the patient, who gave verbal consent to proceed.  History of Present Illness   Lisa Weaver is a 43 year old female who presents for an annual physical. We are also following-up regarding medication side effects and gastrointestinal symptoms.  She had to discontinue venlafaxine  due to gastrointestinal side effects, including hard stools and rectal bleeding. Venlafaxine  initially alleviated her stress and anxiety but led to these adverse effects. Since stopping venlafaxine  three weeks ago, she has not experienced constipation or bleeding. She also experienced sexual dysfunction while on venlafaxine  and is interested in exploring alternative medications for mood management.  She has a history of cholecystectomy and experiences loose stools postprandially, initially attributed to the surgery. Stress management has improved her bowel symptoms. She has not used any prescription medications for bowel management prior to considering cholestyramine . She avoids dairy due to gastrointestinal discomfort, particularly in the summer, and suspects lactose intolerance but has not tried lactase supplements.  She experiences migraines, with approximately two severe episodes per month, and is currently feeling one starting, which she attributes to barometric pressure changes.  She reports  a significant weight gain of about 60 pounds over the past year without any changes in diet or physical activity. She is concerned about this unexplained weight gain and is interested in exploring potential metabolic causes. No recent changes in diet or fluid intake. Last A1C was 5.6% last year.   Her sleep pattern varies, with some nights allowing for adequate rest and others being disrupted by sporadic awakenings. She generally falls asleep quickly when going to bed.  In terms of physical activity, she engages in daily activities such as playing with her child and doing yard work but finds it challenging to incorporate structured exercise into her routine due to time constraints.      Vision:Not within last year  and Dental: Receives regular dental care and Last dental visit: earlier this year   Patient Active Problem List   Diagnosis Date Noted   Prediabetes 01/14/2024   Vitamin D deficiency 01/14/2024   Elevated serum creatinine 01/14/2024   Mood changes 01/14/2024   Weight gain 01/14/2024   Chronic migraine without aura without status migrainosus, not intractable 11/17/2017   Encounter for maternal care for low transverse scar from repeat cesarean delivery 01/22/2017   Born by cesarean section 01/22/2017   Cesarean delivery delivered 01/22/2017   False labor after 37 completed weeks of gestation 01/10/2017   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   Palpitations 05/03/2014   Family history of hypertrophic cardiomyopathy 05/03/2014   Probable Missed abortion 08/15/2012   History of stillbirth 07/27/2012   History of preterm delivery, currently pregnant 07/27/2012   History of cesarean delivery 07/27/2012   MTHFR mutation 12/10/2011   Migraine 12/10/2011   Past Medical History:  Diagnosis Date   Allergy  Seasonal, pollen, grass, cats   Asthma    Blood dyscrasia    MTHFR- genetic clotting issue   Infection    urinary tract infection   MTHFR mutation    Spinal headache    Past  Surgical History:  Procedure Laterality Date   BILATERAL SALPINGECTOMY Bilateral 2018   CESAREAN SECTION     CESAREAN SECTION N/A 01/22/2017   Procedure: CESAREAN SECTION;  Surgeon: Ona Bidding, MD;  Location: [facility]IRTHING SUITES;  Service: Obstetrics;  Laterality: N/A;   CHOLECYSTECTOMY     TONSILLECTOMY AND ADENOIDECTOMY     TUBAL LIGATION  01/22/2017   WISDOM TOOTH EXTRACTION     Social History   Tobacco Use   Smoking status: Never   Smokeless tobacco: Never  Vaping Use   Vaping status: Never Used  Substance Use Topics   Alcohol use: No    Comment: socially   Drug use: No      Medications: Outpatient Medications Prior to Visit  Medication Sig   albuterol (PROVENTIL HFA;VENTOLIN HFA) 108 (90 BASE) MCG/ACT inhaler Inhale 2 puffs into the lungs every 6 (six) hours as needed for wheezing or shortness of breath.    ibuprofen  (ADVIL ,MOTRIN ) 600 MG tablet Take 600 mg by mouth every 6 (six) hours as needed.   rizatriptan  (MAXALT ) 10 MG tablet Take 1 tablet (10 mg total) by mouth 2 (two) times daily as needed for migraine. May repeat in 2 hours if needed   tiZANidine  (ZANAFLEX ) 4 MG tablet Take 1 tablet (4 mg total) by mouth every 6 (six) hours as needed for muscle spasms.   topiramate  (TOPAMAX ) 200 MG tablet Take 1 tablet (200 mg total) by mouth daily.   [DISCONTINUED] cholestyramine  light (PREVALITE ) 4 g packet Take 1 packet (4 g total) by mouth 2 (two) times daily.   venlafaxine  XR (EFFEXOR  XR) 75 MG 24 hr capsule Take 1 capsule (75 mg total) by mouth daily with breakfast.   No facility-administered medications prior to visit.    Allergies  Allergen Reactions   Atenolol      dizziness   Cefprozil Hives   Sulfa Antibiotics Hives    Patient Care Team: Mandy Second, [place] as PCP - General (Physician Assistant)  ROS Complete 12 point ROS performed with all pertinent positives listed in HPI      Objective  BP 114/83   Pulse 82   Ht 5\' 7"  (1.702 m)   Wt 244 lb 6.4 oz  (110.9 kg)   SpO2 98%   BMI 38.28 kg/m  BP Readings from Last 3 Encounters:  03/31/24 114/83  01/14/24 111/80  09/04/20 115/80   Wt Readings from Last 3 Encounters:  03/31/24 244 lb 6.4 oz (110.9 kg)  01/14/24 242 lb 12.8 oz (110.1 kg)  09/04/20 200 lb (90.7 kg)      Physical Exam Vitals and nursing note reviewed.  Constitutional:      General: She is not in acute distress.    Appearance: Normal appearance. She is obese. She is not ill-appearing, toxic-appearing or diaphoretic.  HENT:     Head: Normocephalic and atraumatic.     Right Ear: Tympanic membrane, ear canal and external ear normal. There is no impacted cerumen.     Left Ear: Tympanic membrane, ear canal and external ear normal. There is no impacted cerumen.     Nose: Nose normal.     Mouth/Throat:     Mouth: Mucous membranes are moist.     Pharynx: Oropharynx is clear. No  oropharyngeal exudate or posterior oropharyngeal erythema.  Eyes:     General: No scleral icterus.       Right eye: No discharge.        Left eye: No discharge.     Extraocular Movements: Extraocular movements intact.     Pupils: Pupils are equal, round, and reactive to light.  Neck:     Thyroid : Thyroid  mass present. No thyromegaly or thyroid  tenderness.   Cardiovascular:     Rate and Rhythm: Normal rate and regular rhythm.     Pulses: Normal pulses.     Heart sounds: No murmur heard. Pulmonary:     Effort: Pulmonary effort is normal. No respiratory distress.     Breath sounds: Normal breath sounds. No stridor. No wheezing or rhonchi.  Abdominal:     General: Abdomen is flat. Bowel sounds are normal. There is no distension.     Palpations: Abdomen is soft. There is no mass.     Tenderness: There is no abdominal tenderness. There is no guarding.  Musculoskeletal:     Cervical back: Normal range of motion and neck supple. No rigidity or tenderness.     Right lower leg: No edema.     Left lower leg: No edema.  Lymphadenopathy:      Cervical: No cervical adenopathy.  Skin:    General: Skin is warm and dry.     Coloration: Skin is not jaundiced.     Findings: No bruising, erythema or rash.  Neurological:     General: No focal deficit present.     Mental Status: She is alert and oriented to person, place, and time.     Sensory: No sensory deficit.     Motor: No weakness.  Psychiatric:        Mood and Affect: Mood normal.        Behavior: Behavior normal.       Most recent functional status assessment:     No data to display         Most recent fall risk assessment:    03/31/2024    8:10 AM  Fall Risk   Falls in the past year? 0  Number falls in past yr: 0  Injury with Fall? 0  Risk for fall due to : No Fall Risks  Follow up Falls evaluation completed    Most recent depression screenings:    03/31/2024    8:10 AM 01/14/2024   11:25 AM  PHQ 2/9 Scores  PHQ - 2 Score 1 2  PHQ- 9 Score 3 4   Most recent cognitive screening:     No data to display         Most recent Audit-C alcohol use screening    01/11/2024   12:06 PM  Alcohol Use Disorder Test (AUDIT)  1. How often do you have a drink containing alcohol? 1  2. How many drinks containing alcohol do you have on a typical day when you are drinking? 0  3. How often do you have six or more drinks on one occasion? 0  AUDIT-C Score 1      Patient-reported   A score of 3 or more in women, and 4 or more in men indicates increased risk for alcohol abuse, EXCEPT if all of the points are from question 1   Vision/Hearing Screen: No results found.  Last CBC Lab Results  Component Value Date   WBC 11.1 (H) 01/23/2017   HGB 10.6 (L) 01/23/2017   HCT  31.0 (L) 01/23/2017   MCV 84.9 01/23/2017   MCH 29.0 01/23/2017   RDW 14.4 01/23/2017   PLT 192 01/23/2017   Last metabolic panel Lab Results  Component Value Date   GLUCOSE 101 (H) 07/29/2011   NA 142 07/29/2011   K 3.3 (L) 07/29/2011   CL 110 07/29/2011   CO2 22 07/29/2011   BUN 9  07/29/2011   CREATININE 0.74 01/22/2017   GFRNONAA >60 01/22/2017   CALCIUM 9.2 07/29/2011   PROT 5.5 (L) 07/26/2011   ALBUMIN 2.6 (L) 07/26/2011   BILITOT 0.6 07/26/2011   ALKPHOS 71 07/26/2011   AST 44 (H) 07/26/2011   ALT 33 07/26/2011   Last lipids No results found for: "CHOL", "HDL", "LDLCALC", "LDLDIRECT", "TRIG", "CHOLHDL" Last hemoglobin A1c No results found for: "HGBA1C" Last thyroid  functions No results found for: "TSH", "T3TOTAL", "T4TOTAL", "THYROIDAB" Last vitamin D No results found for: "25OHVITD2", "25OHVITD3", "VD25OH" Last vitamin B12 and Folate No results found for: "VITAMINB12", "FOLATE"    Results for orders placed or performed in visit on 03/31/24  HM HEPATITIS C SCREENING LAB  Result Value Ref Range   HM Hepatitis Screen Negative-Validated       Assessment & Plan   Annual wellness visit done today including the all of the following: Reviewed patient's Family Medical History Reviewed and updated list of patient's medical providers Assessment of cognitive impairment was done Assessed patient's functional ability Established a written schedule for health screening services Health Risk Assessent Completed and Reviewed  Exercise Activities and Dietary recommendations  Goals   Weight loss - 1# per week through calorie counting for now; consider additional weight interventions upon follow up. Goal weight - 170#.     Immunization History  Administered Date(s) Administered   Influenza Split 07/27/2012   PFIZER(Purple Top)SARS-COV-2 Vaccination 01/19/2020, 02/13/2020   Tdap 11/06/2016, 08/21/2022    Health Maintenance  Topic Date Due   COVID-19 Vaccine (3 - 2024-25 season) 01/24/2025 (Originally 07/04/2023)   Cervical Cancer Screening (HPV/Pap Cotest)  10/17/2026   DTaP/Tdap/Td (3 - Td or Tdap) 08/21/2032   Hepatitis C Screening  Completed   HIV Screening  Completed   HPV VACCINES  Aged Out   Meningococcal B Vaccine  Aged Out   INFLUENZA VACCINE   Discontinued     Discussed health benefits of physical activity, and encouraged her to engage in regular exercise appropriate for her age and condition.    Problem List Items Addressed This Visit     Mood changes   Relevant Medications   buPROPion (WELLBUTRIN XL) 150 MG 24 hr tablet   Weight gain   Relevant Orders   Insulin and C-Peptide   Other Visit Diagnoses       Encounter for routine adult health examination without abnormal findings    -  Primary   Relevant Orders   CBC   Comprehensive metabolic panel with GFR   Hemoglobin A1c   Lipid panel   TSH     Neck mass       Relevant Orders   US  THYROID       Assessment and Plan    General Health Maintenance/ Annual physical exam Encouraged regular exercise and dietary improvements for mental health and well-being. - Encourage regular exercise, at least two days a week. - Consider dietary changes to include more vegetables and balanced meals.  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 Venlafaxine  improved mood but caused constipation  and bleeding. Considered serotonin's role on smooth muscle function. - Discontinue cholestyramine . - Consider Levsin pending response to new SNRI.  Depression Venlafaxine  improved mood but caused constipation and sexual dysfunction. Bupropion chosen for lower sexual dysfunction risk and mood benefits. - Prescribe bupropion 150 mg once daily, increase to 300 mg if needed. - Monitor effectiveness and side effects in 2-4 weeks via MyChart.  Thyroid  Nodule Palpable nodule, possibly a cyst. No dysphagia or constriction symptoms. - Order thyroid  function tests. - Schedule thyroid  ultrasound at [facility] outpatient imaging center.  Weight Gain 60-pound gain over the past year without lifestyle changes. Considering metabolic causes like thyroid  dysfunction or insulin resistance. -  Order insulin level test. - Discuss sustainable weight loss strategies, aim for one pound per week loss through diet and activity. - consider phentermine  Migraine Migraines occur twice a month, possibly triggered by barometric pressure changes. Well-managed.        Return in about 8 weeks (around 05/26/2024).     Mandy Second, PA

## 2024-03-31 NOTE — Patient Instructions (Signed)
 We completed your annual physical and fasting labs.  Please calculate your total calorie intake for a few days and let me know where you stand. Shoot for 1600-1700 kcal daily. Try to increase proteins and fiber through veggies.  Try to get 20 minutes of physical activity, even if spread throughout the day.  Start bupropion daily. Follow up in 6-8 weeks. [facility] [address] [place], [place] [address] Phone: [phone]

## 2024-04-02 LAB — INSULIN AND C-PEPTIDE, SERUM
C-Peptide: 2.8 ng/mL (ref 1.1–4.4)
INSULIN: 11.1 u[IU]/mL (ref 2.6–24.9)

## 2024-04-12 ENCOUNTER — Other Ambulatory Visit: Payer: Self-pay

## 2024-04-13 ENCOUNTER — Other Ambulatory Visit: Payer: Self-pay

## 2024-04-13 DIAGNOSIS — R197 Diarrhea, unspecified: Secondary | ICD-10-CM

## 2024-04-13 DIAGNOSIS — Z9049 Acquired absence of other specified parts of digestive tract: Secondary | ICD-10-CM

## 2024-04-13 MED ORDER — CHOLESTYRAMINE LIGHT 4 G PO PACK: 4.0000 g | PACK | Freq: Two times a day (BID) | ORAL | 2 refills | Status: AC

## 2024-04-22 ENCOUNTER — Encounter: Payer: Self-pay | Admitting: Urgent Care

## 2024-04-22 DIAGNOSIS — R4586 Emotional lability: Secondary | ICD-10-CM

## 2024-04-25 MED ORDER — VENLAFAXINE HCL ER 75 MG PO CP24: 75.0000 mg | ORAL_CAPSULE | Freq: Every day | ORAL | 1 refills | Status: DC

## 2024-05-30 ENCOUNTER — Ambulatory Visit (HOSPITAL_COMMUNITY)
Admission: RE | Admit: 2024-05-30 | Discharge: 2024-05-30 | Disposition: A | Discharge status: Home / Self Care | Source: Ambulatory Visit | Attending: Urgent Care | Admitting: Urgent Care

## 2024-05-30 DIAGNOSIS — R221 Localized swelling, mass and lump, neck: Secondary | ICD-10-CM | POA: Diagnosis not present

## 2024-05-30 DIAGNOSIS — R220 Localized swelling, mass and lump, head: Secondary | ICD-10-CM | POA: Diagnosis not present

## 2024-06-26 ENCOUNTER — Other Ambulatory Visit: Payer: Self-pay | Admitting: Urgent Care

## 2024-06-26 DIAGNOSIS — R4586 Emotional lability: Secondary | ICD-10-CM

## 2024-08-09 ENCOUNTER — Other Ambulatory Visit: Payer: Self-pay | Admitting: Obstetrics and Gynecology

## 2024-08-09 DIAGNOSIS — Z1231 Encounter for screening mammogram for malignant neoplasm of breast: Secondary | ICD-10-CM

## 2024-10-16 ENCOUNTER — Ambulatory Visit
Admission: RE | Admit: 2024-10-16 | Discharge: 2024-10-16 | Disposition: A | Source: Ambulatory Visit | Attending: Obstetrics and Gynecology | Admitting: Obstetrics and Gynecology

## 2024-10-16 DIAGNOSIS — Z1231 Encounter for screening mammogram for malignant neoplasm of breast: Secondary | ICD-10-CM | POA: Diagnosis not present

## 2024-12-08 ENCOUNTER — Telehealth: Payer: Self-pay

## 2024-12-08 ENCOUNTER — Other Ambulatory Visit: Payer: Self-pay | Admitting: Urgent Care

## 2024-12-08 DIAGNOSIS — R4586 Emotional lability: Secondary | ICD-10-CM

## 2024-12-08 MED ORDER — VENLAFAXINE HCL ER 75 MG PO CP24: 75.0000 mg | ORAL_CAPSULE | Freq: Every day | ORAL | 0 refills | Status: AC

## 2024-12-08 NOTE — Telephone Encounter (Signed)
 Patient informed and scheduled for Mar 09, 2025 for CPE

## 2024-12-08 NOTE — Telephone Encounter (Signed)
 Requesting rx rf of Effexor  XR 75mg   Last written 04/25/2024 Last OV - no OV showing in patient chart with PCK Upcoming appt = none

## 2024-12-08 NOTE — Telephone Encounter (Signed)
 Copied from CRM [id number]. Topic: Clinical - Medication Refill >> Dec 08, 2024  9:56 AM Kevelyn M wrote: Medication: venlafaxine  XR (EFFEXOR  XR) 75 MG 24 hr capsule  Has the patient contacted their pharmacy? Yes (Agent: If no, request that the patient contact the pharmacy for the refill. If patient does not wish to contact the pharmacy document the reason why and proceed with request.) (Agent: If yes, when and what did the pharmacy advise?)  This is the patient's preferred pharmacy:  [id number] - [place], [place] - [address] [place] [address] [place] [address] [place] [address] Phone: [phone] Fax: [phone]  Is this the correct pharmacy for this prescription? Yes If no, delete pharmacy and type the correct one.   Has the prescription been filled recently? No  Is the patient out of the medication? No  Has the patient been seen for an appointment in the last year OR does the patient have an upcoming appointment? Yes  Can we respond through MyChart? Yes  Agent: Please be advised that Rx refills may take up to 3 business days. We ask that you follow-up with your pharmacy.

## 2024-12-08 NOTE — Progress Notes (Signed)
 Called in 90d supply of Effexor . Pt will need to schedule annual PE with fasting labs in May for further refills.

## 2024-12-08 NOTE — Telephone Encounter (Signed)
 Please notify pt I will call in a 90 day supply. She is due for her annual PE with me in May and should get this booked.

## 2025-03-09 ENCOUNTER — Encounter: Admitting: Urgent Care
